# Patient Record
Sex: Male | Born: 1955 | Race: Black or African American | Hispanic: No | Marital: Married | Smoking: Former smoker
Health system: Southern US, Community
[De-identification: ages and names within clinical notes are randomized; demographics above are authoritative.]

## PROBLEM LIST (undated history)

## (undated) DIAGNOSIS — I10 Essential (primary) hypertension: Secondary | ICD-10-CM

## (undated) DIAGNOSIS — K219 Gastro-esophageal reflux disease without esophagitis: Secondary | ICD-10-CM

## (undated) HISTORY — PX: NECK SURGERY: SHX720

---

## 1999-01-09 ENCOUNTER — Ambulatory Visit (HOSPITAL_COMMUNITY): Admission: RE | Admit: 1999-01-09 | Discharge: 1999-01-09 | Payer: Self-pay | Admitting: Chiropractic Medicine

## 1999-01-09 ENCOUNTER — Encounter: Payer: Self-pay | Admitting: Chiropractic Medicine

## 2004-02-25 ENCOUNTER — Ambulatory Visit (HOSPITAL_BASED_OUTPATIENT_CLINIC_OR_DEPARTMENT_OTHER): Admission: RE | Admit: 2004-02-25 | Discharge: 2004-02-25 | Payer: Self-pay | Admitting: Orthopedic Surgery

## 2004-02-25 ENCOUNTER — Ambulatory Visit (HOSPITAL_COMMUNITY): Admission: RE | Admit: 2004-02-25 | Discharge: 2004-02-25 | Payer: Self-pay | Admitting: Orthopedic Surgery

## 2004-03-02 ENCOUNTER — Ambulatory Visit: Payer: Self-pay | Admitting: Internal Medicine

## 2005-05-04 ENCOUNTER — Ambulatory Visit (HOSPITAL_BASED_OUTPATIENT_CLINIC_OR_DEPARTMENT_OTHER): Admission: RE | Admit: 2005-05-04 | Discharge: 2005-05-04 | Payer: Self-pay | Admitting: Orthopedic Surgery

## 2005-05-04 ENCOUNTER — Encounter (INDEPENDENT_AMBULATORY_CARE_PROVIDER_SITE_OTHER): Payer: Self-pay | Admitting: Specialist

## 2006-06-02 ENCOUNTER — Encounter: Admission: RE | Admit: 2006-06-02 | Discharge: 2006-07-14 | Payer: Self-pay | Admitting: Internal Medicine

## 2006-11-02 ENCOUNTER — Encounter: Admission: RE | Admit: 2006-11-02 | Discharge: 2006-11-02 | Payer: Self-pay | Admitting: Neurosurgery

## 2006-11-16 ENCOUNTER — Encounter: Admission: RE | Admit: 2006-11-16 | Discharge: 2006-11-16 | Payer: Self-pay | Admitting: Neurosurgery

## 2006-12-08 ENCOUNTER — Ambulatory Visit: Payer: Self-pay | Admitting: Gastroenterology

## 2007-02-08 ENCOUNTER — Ambulatory Visit (HOSPITAL_COMMUNITY): Admission: RE | Admit: 2007-02-08 | Discharge: 2007-02-08 | Payer: Self-pay | Admitting: Neurosurgery

## 2008-02-06 ENCOUNTER — Emergency Department: Payer: Self-pay | Admitting: Emergency Medicine

## 2010-09-03 NOTE — Op Note (Signed)
Ricky Gross, Ricky Gross             ACCOUNT NO.:  [id number]   MEDICAL RECORD NO.:  [id number]          PATIENT TYPE:  AMB   LOCATION:  DSC                          FACILITY:  [facility]   PHYSICIAN:  Artist Pais. Weingold, M.D.DATE OF BIRTH:  12-15-1955   DATE OF PROCEDURE:  02/25/2004  DATE OF DISCHARGE:                                 OPERATIVE REPORT   PREOPERATIVE DIAGNOSIS:  Right carpal tunnel syndrome.   POSTOPERATIVE DIAGNOSIS:  Right carpal tunnel syndrome.   PROCEDURE:  Right carpal tunnel release.   SURGEON:  Artist Pais. Mina Marble, M.D.   ASSISTANT:  None.   ANESTHESIA:  General.   TOURNIQUET TIME:  Eleven minutes.   COMPLICATIONS:  None.   DRAINS:  None.   DESCRIPTION OF PROCEDURE:  The patient was taken to the operating room after  the induction of adequate general anesthesia.  The right upper extremity is  prepped and draped in the usual sterile fashion.  Esmarch was used to  exsanguinate the limb.  Tourniquet was inflated to 250 mmHg.  At this point  and time, a 2 cm incision was made in the palmar aspect of the right hand in  line with the long finger metacarpal starting at [facility] cardinal line.  The  incision was taken down through the skin and subcutaneous tissue.  The  palmar fascia was identified and split.  The distal edge of the transverse  carpal ligament was divided with a 15 blade exposing the underlying median  nerve and contents of the carpal canal.  The median nerve was protected with  a freer elevator and the remaining aspects of the transverse carpal ligament  were divided under direct vision using a curved blunt scissor.   The canal was inspected.  There were no osseous lesions or ganglias present.  The wound was irrigated and loosely closed with a running 3-0 Prolene  subcuticular stitch.  Steri-Strips, 4 by 4 fluffs, and compressive dressing  was applied.   The patient tolerated the procedure well and went to the recovery room in  stable  fashion.     MAW/MEDQ  D:  02/25/2004  T:  02/25/2004  Job:  [id number]

## 2010-09-03 NOTE — Op Note (Signed)
NAMELADON, Ricky Gross             ACCOUNT NO.:  [id number]   MEDICAL RECORD NO.:  [id number]          PATIENT TYPE:  AMB   LOCATION:  DSC                          FACILITY:  [facility]   PHYSICIAN:  Artist Pais. Weingold, M.D.DATE OF BIRTH:  1955-06-30   DATE OF PROCEDURE:  05/04/2005  DATE OF DISCHARGE:                                 OPERATIVE REPORT   PREOPERATIVE DIAGNOSIS:  Right elbow chronic lateral epicondylitis.   POSTOPERATIVE DIAGNOSIS:  Right elbow chronic lateral epicondylitis.   PROCEDURE:  Right elbow extensor carpi radialis brevis debridement with  partial lateral epicondylar ostectomy.   SURGEON:  Artist Pais. Mina Marble, M.D.   ASSISTANT:  None.   ANESTHESIA:  General.   TOURNIQUET TIME:  40 minutes.   COMPLICATIONS:  None.   DRAINS:  None.   OPERATIVE REPORT:  The patient was taken to the operating room after the  induction of adequate general anesthesia.  The right upper extremity was  prepped and draped in the usual sterile fashion.  An Esmarch was used to  exsanguinate the limb.  The tourniquet was then inflated to 250 mmHg.  At  this point in time a longitudinal incision was made over the lateral  epicondyle extended proximally off the epicondylar ridge for 1 cm and  distally for 4 cm.  The skin was incised.  The interval between the extensor  carpi radialis longus and the extensor digitorum communis was identified and  split.  Flaps were raised carefully off the underlying extensor carpi  radialis brevis.  There was significant degeneration and tearing of the  extensor carpi radialis brevis at its insertion into the lateral epicondyle.  This degenerated area was carefully excised down to the level of the joint  capsule.  Using a rongeur the remaining aspects of the extensor carpi  radialis brevis were debrided.  The epicondylar area was then decorticated  down to bleeding cancellous bone using a combination of a rongeur and  curettes. At this point in time  the interval between the [facility] and the ECRL  was repaired using inverted #0 Vicryl sutures from proximal to distal.  After this is done, the skin was closed with a running 3-0 Prolene  subcuticular stitch, Steri-Strips, 4 x 4 fluffs, compressive dressing, and a  posterior splint with the elbow flexed at 90-degrees and the forearm in  neutral and the wrist in slight extension was applied.   The patient tolerated the procedure well and was sent to the recovery room.      Artist Pais Mina Marble, M.D.  Electronically Signed     MAW/MEDQ  D:  05/04/2005  T:  05/04/2005  Job:  [id number]

## 2011-09-29 ENCOUNTER — Other Ambulatory Visit: Payer: Self-pay | Admitting: Occupational Medicine

## 2011-09-29 ENCOUNTER — Ambulatory Visit: Payer: Self-pay

## 2011-09-29 DIAGNOSIS — R52 Pain, unspecified: Secondary | ICD-10-CM

## 2016-11-14 ENCOUNTER — Ambulatory Visit: Payer: Self-pay | Admitting: Family Medicine

## 2017-02-14 DIAGNOSIS — K219 Gastro-esophageal reflux disease without esophagitis: Secondary | ICD-10-CM | POA: Diagnosis present

## 2017-02-17 DIAGNOSIS — R7303 Prediabetes: Secondary | ICD-10-CM | POA: Diagnosis present

## 2017-02-17 DIAGNOSIS — E7800 Pure hypercholesterolemia, unspecified: Secondary | ICD-10-CM | POA: Diagnosis present

## 2019-06-10 ENCOUNTER — Other Ambulatory Visit: Payer: Self-pay | Admitting: Family Medicine

## 2019-06-10 ENCOUNTER — Ambulatory Visit: Payer: Self-pay

## 2019-06-10 ENCOUNTER — Encounter (HOSPITAL_COMMUNITY): Payer: Self-pay | Admitting: Family Medicine

## 2019-06-10 ENCOUNTER — Other Ambulatory Visit: Payer: Self-pay

## 2019-06-10 ENCOUNTER — Ambulatory Visit
Admission: RE | Admit: 2019-06-10 | Discharge: 2019-06-10 | Disposition: A | Discharge status: Home / Self Care | Payer: No Typology Code available for payment source | Source: Ambulatory Visit | Attending: Family Medicine | Admitting: Family Medicine

## 2019-06-10 ENCOUNTER — Ambulatory Visit (HOSPITAL_COMMUNITY)
Admission: EM | Admit: 2019-06-10 | Discharge: 2019-06-10 | Disposition: A | Discharge status: Home / Self Care | Payer: Self-pay

## 2019-06-10 DIAGNOSIS — M25552 Pain in left hip: Secondary | ICD-10-CM

## 2019-06-10 NOTE — ED Notes (Signed)
Patient came to the wrong place, arrived mistakenly by registration. Patient given directions to occupational health building.

## 2019-07-18 DIAGNOSIS — I10 Essential (primary) hypertension: Secondary | ICD-10-CM | POA: Diagnosis present

## 2019-08-02 ENCOUNTER — Encounter: Payer: Self-pay | Admitting: Emergency Medicine

## 2019-08-02 ENCOUNTER — Emergency Department: Payer: BC Managed Care – PPO

## 2019-08-02 ENCOUNTER — Other Ambulatory Visit: Payer: Self-pay

## 2019-08-02 ENCOUNTER — Emergency Department
Admission: EM | Admit: 2019-08-02 | Discharge: 2019-08-02 | Disposition: A | Discharge status: Home / Self Care | Payer: BC Managed Care – PPO | Attending: Emergency Medicine | Admitting: Emergency Medicine

## 2019-08-02 DIAGNOSIS — Z87891 Personal history of nicotine dependence: Secondary | ICD-10-CM | POA: Diagnosis not present

## 2019-08-02 DIAGNOSIS — E162 Hypoglycemia, unspecified: Secondary | ICD-10-CM | POA: Diagnosis not present

## 2019-08-02 DIAGNOSIS — Y908 Blood alcohol level of 240 mg/100 ml or more: Secondary | ICD-10-CM | POA: Diagnosis not present

## 2019-08-02 DIAGNOSIS — F10920 Alcohol use, unspecified with intoxication, uncomplicated: Secondary | ICD-10-CM | POA: Diagnosis not present

## 2019-08-02 DIAGNOSIS — E860 Dehydration: Secondary | ICD-10-CM | POA: Insufficient documentation

## 2019-08-02 DIAGNOSIS — R4182 Altered mental status, unspecified: Secondary | ICD-10-CM | POA: Diagnosis present

## 2019-08-02 LAB — URINE DRUG SCREEN, QUALITATIVE (ARMC ONLY)
Amphetamines, Ur Screen: NOT DETECTED
Barbiturates, Ur Screen: NOT DETECTED
Benzodiazepine, Ur Scrn: NOT DETECTED
Cannabinoid 50 Ng, Ur ~~LOC~~: NOT DETECTED
Cocaine Metabolite,Ur ~~LOC~~: NOT DETECTED
MDMA (Ecstasy)Ur Screen: NOT DETECTED
Methadone Scn, Ur: NOT DETECTED
Opiate, Ur Screen: NOT DETECTED
Phencyclidine (PCP) Ur S: NOT DETECTED
Tricyclic, Ur Screen: NOT DETECTED

## 2019-08-02 LAB — CBC
HCT: 47.8 % (ref 39.0–52.0)
Hemoglobin: 16 g/dL (ref 13.0–17.0)
MCH: 30.2 pg (ref 26.0–34.0)
MCHC: 33.5 g/dL (ref 30.0–36.0)
MCV: 90.2 fL (ref 80.0–100.0)
Platelets: 242 10*3/uL (ref 150–400)
RBC: 5.3 MIL/uL (ref 4.22–5.81)
RDW: 13.2 % (ref 11.5–15.5)
WBC: 4.8 10*3/uL (ref 4.0–10.5)
nRBC: 0 % (ref 0.0–0.2)

## 2019-08-02 LAB — COMPREHENSIVE METABOLIC PANEL
ALT: 16 U/L (ref 0–44)
AST: 19 U/L (ref 15–41)
Albumin: 4.6 g/dL (ref 3.5–5.0)
Alkaline Phosphatase: 70 U/L (ref 38–126)
Anion gap: 17 — ABNORMAL HIGH (ref 5–15)
BUN: 18 mg/dL (ref 8–23)
CO2: 19 mmol/L — ABNORMAL LOW (ref 22–32)
Calcium: 9.1 mg/dL (ref 8.9–10.3)
Chloride: 104 mmol/L (ref 98–111)
Creatinine, Ser: 1 mg/dL (ref 0.61–1.24)
GFR calc Af Amer: >60 mL/min (ref >60)
GFR calc non Af Amer: >60 mL/min (ref >60)
Glucose, Bld: 70 mg/dL (ref 70–99)
Potassium: 4.3 mmol/L (ref 3.5–5.1)
Sodium: 140 mmol/L (ref 135–145)
Total Bilirubin: 0.8 mg/dL (ref 0.3–1.2)
Total Protein: 7.7 g/dL (ref 6.5–8.1)

## 2019-08-02 LAB — BASIC METABOLIC PANEL
Anion gap: 10 (ref 5–15)
BUN: 18 mg/dL (ref 8–23)
CO2: 29 mmol/L (ref 22–32)
Calcium: 8.9 mg/dL (ref 8.9–10.3)
Chloride: 106 mmol/L (ref 98–111)
Creatinine, Ser: 1.02 mg/dL (ref 0.61–1.24)
GFR calc Af Amer: >60 mL/min (ref >60)
GFR calc non Af Amer: >60 mL/min (ref >60)
Glucose, Bld: 123 mg/dL — ABNORMAL HIGH (ref 70–99)
Potassium: 3.8 mmol/L (ref 3.5–5.1)
Sodium: 145 mmol/L (ref 135–145)

## 2019-08-02 LAB — GLUCOSE, CAPILLARY
Glucose-Capillary: 65 mg/dL — ABNORMAL LOW (ref 70–99)
Glucose-Capillary: 66 mg/dL — ABNORMAL LOW (ref 70–99)
Glucose-Capillary: 107 mg/dL — ABNORMAL HIGH (ref 70–99)

## 2019-08-02 LAB — ETHANOL: Alcohol, Ethyl (B): 340 mg/dL (ref <10)

## 2019-08-02 MED ORDER — SODIUM CHLORIDE 0.9 % IV BOLUS
1000.0000 mL | Freq: Once | INTRAVENOUS | Status: AC
  Administered 2019-08-02: 1000 mL via INTRAVENOUS

## 2019-08-02 MED ORDER — CHLORDIAZEPOXIDE HCL 5 MG PO CAPS: 5.0000 mg | ORAL_CAPSULE | Freq: Three times a day (TID) | ORAL | 0 refills | Status: DC | PRN

## 2019-08-02 MED ORDER — DEXTROSE 50 % IV SOLN
50.0000 mL | Freq: Once | INTRAVENOUS | Status: AC
  Administered 2019-08-02: 50 mL via INTRAVENOUS
  Filled 2019-08-02: qty 50

## 2019-08-02 NOTE — ED Triage Notes (Signed)
Pt in via EMS from home with c/o overdose. EMS reports pts brother found him laying on the floor and he was acting right. Pt denies ETOH or drug use. EMS reports alcohol and paraphernalia at the scene.

## 2019-08-02 NOTE — ED Notes (Signed)
IV fluids finished, repeat blood work drawn and to the lab.

## 2019-08-02 NOTE — ED Provider Notes (Signed)
[facility] EMERGENCY DEPARTMENT Provider Note   CSN: [id number] Arrival date & time: 08/02/19  1607     History Chief Complaint  Patient presents with  . Drug Overdose    Ricky Gross is a 64 y.o. male hx of alcohol use who presented with confusion.  Patient apparently was drinking alcohol today and was found by his brother laying on the floor and altered .  Patient apparently was found to have other substances next to him as well.  Patient denies drinking alcohol but appears visibly intoxicated.  Patient was also noted to be very confused in triage.  Denies any suicidal homicidal ideations.  He denies any drug use.  He was noted to be hypoglycemic to 66 in triage.  The history is provided by the patient.       History reviewed. No pertinent past medical history.  There are no problems to display for this patient.   History reviewed. No pertinent surgical history.     No family history on file.  Social History   Tobacco Use  . Smoking status: Former Research scientist (life sciences)  . Smokeless tobacco: Never Used  Substance Use Topics  . Alcohol use: Not Currently  . Drug use: Not Currently    Home Medications Prior to Admission medications   Not on File    Allergies    Patient has no known allergies.  Review of Systems   Review of Systems  Psychiatric/Behavioral: Positive for confusion.  All other systems reviewed and are negative.   Physical Exam Updated Vital Signs BP 124/76 (BP Location: Left Arm)   Pulse 88   Temp 97.7 F (36.5 C) (Oral)   Resp 20   Ht 5\' 5"  (1.651 m)   Wt 72.6 kg   SpO2 96%   BMI 26.63 kg/m   Physical Exam Vitals and nursing note reviewed.  Constitutional:      Comments: Confused, intoxicated   HENT:     Head: Normocephalic.     Comments: No obvious scalp hematoma     Mouth/Throat:     Mouth: Mucous membranes are moist.  Eyes:     Extraocular Movements: Extraocular movements intact.     Pupils: Pupils are equal,  round, and reactive to light.  Cardiovascular:     Rate and Rhythm: Normal rate and regular rhythm.     Pulses: Normal pulses.     Heart sounds: Normal heart sounds.  Pulmonary:     Effort: Pulmonary effort is normal.     Breath sounds: Normal breath sounds.  Abdominal:     General: Abdomen is flat.     Palpations: Abdomen is soft.  Musculoskeletal:        General: Normal range of motion.     Cervical back: Normal range of motion.  Skin:    General: Skin is warm.     Capillary Refill: Capillary refill takes less than 2 seconds.  Neurological:     Comments: Disoriented, moving all extremities, unable to walk due to intoxication      ED Results / Procedures / Treatments   Labs (all labs ordered are listed, but only abnormal results are displayed) Labs Reviewed  GLUCOSE, CAPILLARY - Abnormal; Notable for the following components:      Result Value   Glucose-Capillary 66 (*)    All other components within normal limits  COMPREHENSIVE METABOLIC PANEL - Abnormal; Notable for the following components:   CO2 19 (*)    Anion gap 17 (*)  All other components within normal limits  ETHANOL - Abnormal; Notable for the following components:   Alcohol, Ethyl (B) 340 (*)    All other components within normal limits  GLUCOSE, CAPILLARY - Abnormal; Notable for the following components:   Glucose-Capillary 65 (*)    All other components within normal limits  CBC  URINE DRUG SCREEN, QUALITATIVE ([facility] ONLY)  CBG MONITORING, ED    EKG None  Radiology No results found.  Procedures Procedures (including critical care time)  Medications Ordered in ED Medications  dextrose 50 % solution 50 mL (50 mLs Intravenous Given 08/02/19 1718)  sodium chloride 0.9 % bolus 1,000 mL (1,000 mLs Intravenous New Bag/Given 08/02/19 1718)    ED Course  I have reviewed the triage vital signs and the nursing notes.  Pertinent labs & imaging results that were available during my care of the patient  were reviewed by me and considered in my medical decision making (see chart for details).    MDM Rules/Calculators/A&P                     Ricky Gross is a 64 y.o. male is here with confusion.  Likely alcohol intoxication.  Also consider polysubstance abuse as well .  No obvious signs of head injury.  Patient appears very confused so we will get a CT head .  Will get labs and tox levels as well.  Additional history obtained:  Previous records obtained and reviewed   Lab Tests:  I Ordered, reviewed, and interpreted labs, which included:   CBC, CMP, ETOH  Imaging Studies ordered:  I ordered imaging studies which included CT head, I independently visualized and interpreted imaging which showed normal  Medicines ordered:  I ordered medication D50 For hypoglycemia    9:10 PM Patient here with confusion and alcohol intoxication.  Patient's initial alcohol level was about 340 .  Patient also has some mild anion gap acidosis as well.  He adamantly denies any toxic alcohol ingestion.  After IV fluids, his BMP showed normal anion gap.  His blood sugar was initially 65 but after some food and D50, and went up to 123 on the chemistry.  Patient has been eating and drinking well. Wife is at bedside and will take him home.  Told him to stop drinking and patient can take some Librium as needed for withdrawal.     Final Clinical Impression(s) / ED Diagnoses Final diagnoses:  None    Rx / DC Orders ED Discharge Orders    None       Charlynne Pander, MD 08/02/19 2111

## 2019-08-02 NOTE — ED Notes (Signed)
Repeat blood glucose 107, patient given a sandwich box. Will repeat BNP after meal and IVF bolus is completed.

## 2019-08-02 NOTE — Discharge Instructions (Addendum)
Stay hydrated  Take librium as needed   You drank too much alcohol, avoid drinking alcohol   See your doctor  Return to ER if you have passing out, lethargy, vomiting, withdrawal

## 2019-08-02 NOTE — ED Notes (Signed)
Patient off unit to ct  

## 2019-08-02 NOTE — ED Triage Notes (Signed)
See first RN note; pt continues to deny ETOH/drug use. Pt with noted slurring words in triage, repeatedly states, "if it was about 12 o'clock I might".

## 2019-08-02 NOTE — ED Notes (Signed)
Wife at bedside.

## 2020-02-10 DIAGNOSIS — N528 Other male erectile dysfunction: Secondary | ICD-10-CM | POA: Insufficient documentation

## 2020-05-29 ENCOUNTER — Other Ambulatory Visit: Payer: Self-pay | Admitting: Family Medicine

## 2020-05-29 DIAGNOSIS — G8929 Other chronic pain: Secondary | ICD-10-CM

## 2020-05-29 DIAGNOSIS — M5442 Lumbago with sciatica, left side: Secondary | ICD-10-CM

## 2020-06-06 ENCOUNTER — Other Ambulatory Visit: Payer: Self-pay

## 2020-06-06 ENCOUNTER — Ambulatory Visit
Admission: RE | Admit: 2020-06-06 | Discharge: 2020-06-06 | Disposition: A | Discharge status: Home / Self Care | Payer: BC Managed Care – PPO | Source: Ambulatory Visit | Attending: Family Medicine | Admitting: Family Medicine

## 2020-06-06 DIAGNOSIS — M5442 Lumbago with sciatica, left side: Secondary | ICD-10-CM | POA: Diagnosis not present

## 2020-06-06 DIAGNOSIS — G8929 Other chronic pain: Secondary | ICD-10-CM

## 2021-07-11 IMAGING — MR MR LUMBAR SPINE W/O CM
5 series · 31 of 48 positions shown · non-contrast
Comparison: Lumbar CT myelogram 02/08/2007

CLINICAL DATA: Low back pain with left leg pain and tingling. Fall
in [DATE].

EXAM:
MRI LUMBAR SPINE WITHOUT CONTRAST
TECHNIQUE: Multiplanar, multisequence MR imaging of the lumbar spine was
performed. No intravenous contrast was administered.

[Series 5: T2 · sagittal · 4.0mm · 0.81mm/px · 6 of 15 slices shown (1 of 2)]
[im 1/15]
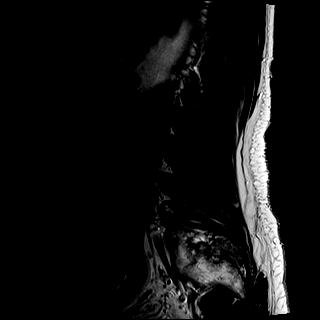
[im 3/15]
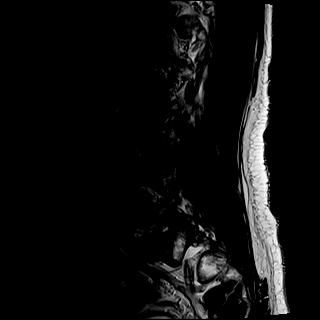
[im 6/15]
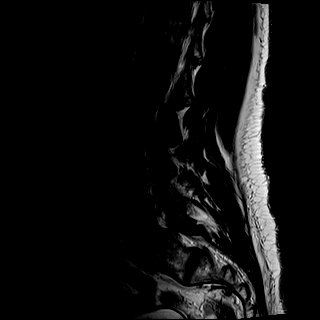
[im 9/15]
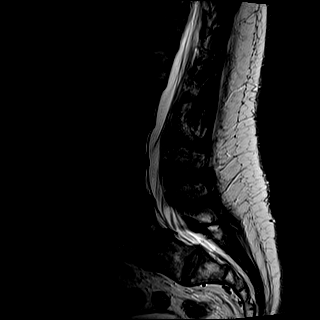
[im 12/15]
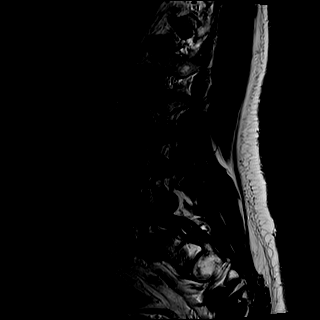
[im 15/15]
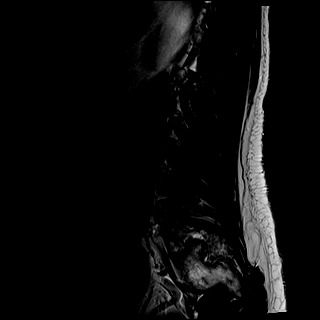

[Series 6: T1 · sagittal · 4.0mm · 0.81mm/px · 7 of 15 slices shown (1 of 2)]
[im 1/15]
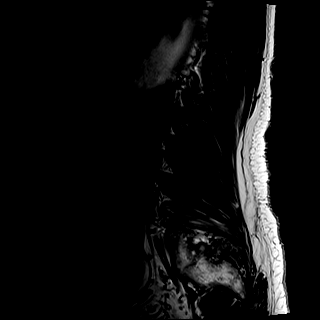
[im 3/15]
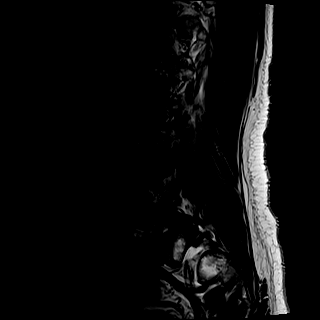
[im 5/15]
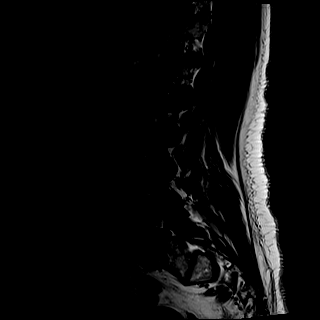
[im 8/15]
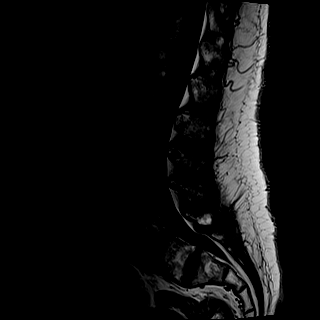
[im 10/15]
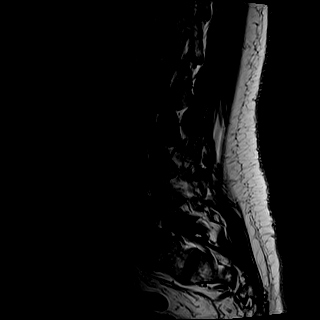
[im 12/15]
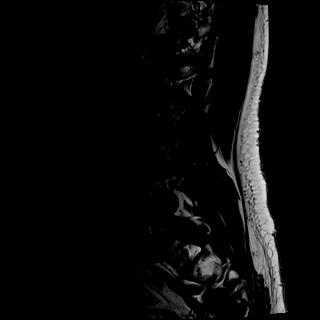
[im 15/15]
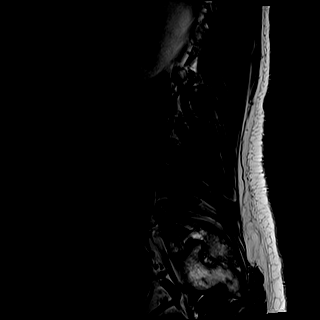

[Series 7: STIR · sagittal · 4.0mm · 0.41mm/px · 2 of 15 slices shown]
[im 1/15]
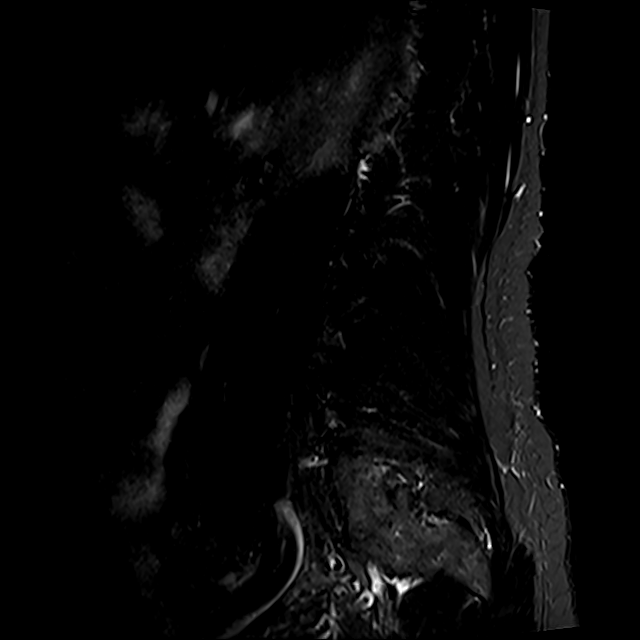
[im 3/15]
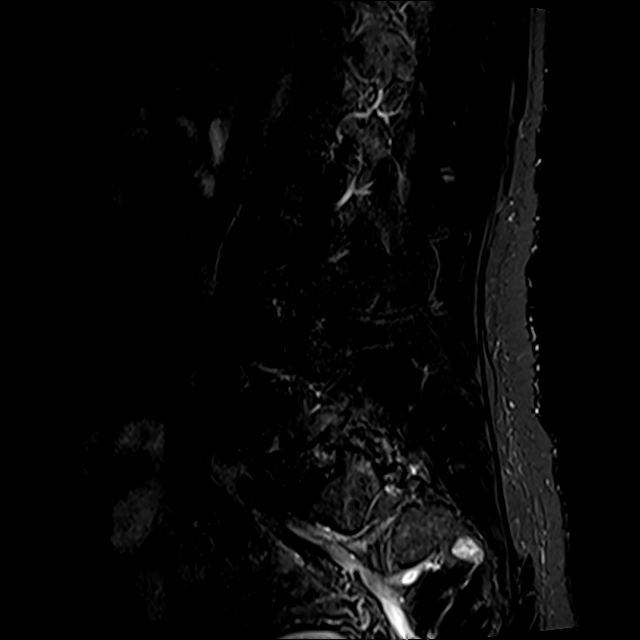

[Series 8: T2 · axial · 4.0mm · 0.78mm/px · z∈[-165,+62]mm · 8 of 32 slices shown (2 of 2)]
[im 1/32]
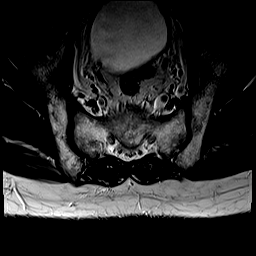
[im 5/32]
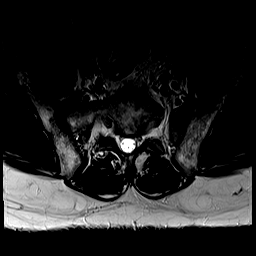
[im 10/32]
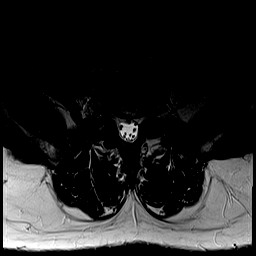
[im 15/32]
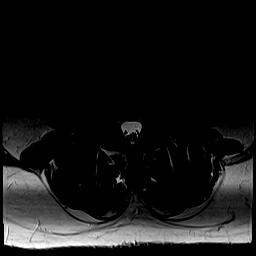
[im 17/32]
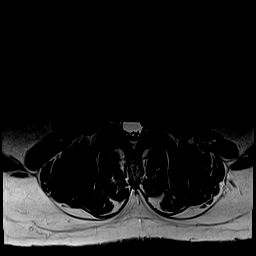
[im 22/32]
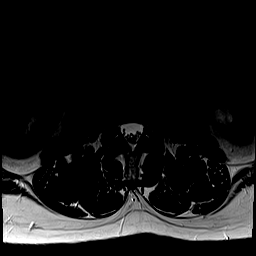
[im 27/32]
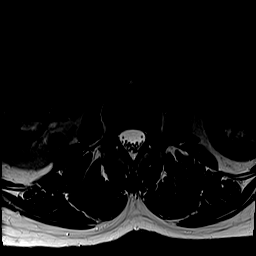
[im 32/32]
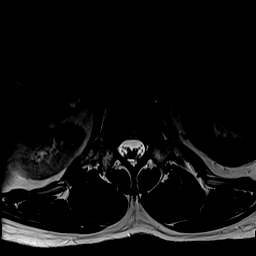

[Series 9: T1 · axial · 4.0mm · 0.39mm/px · z∈[-165,+62]mm · 8 of 32 slices shown (2 of 2)]
[im 1/32]
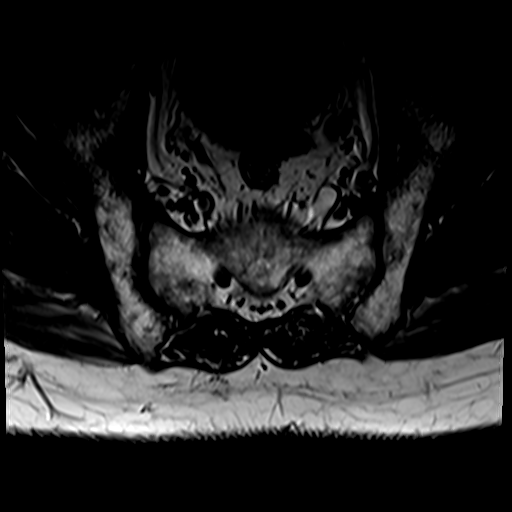
[im 5/32]
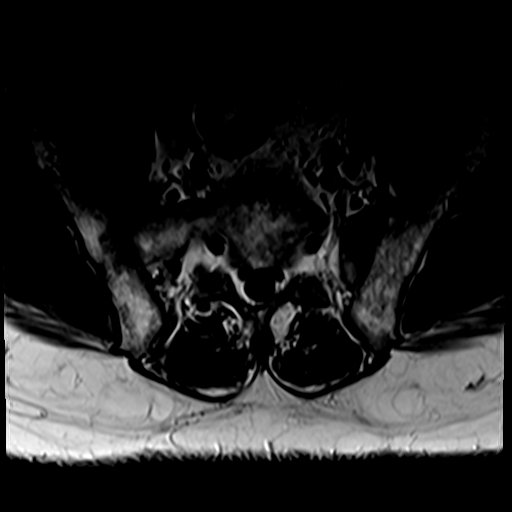
[im 10/32]
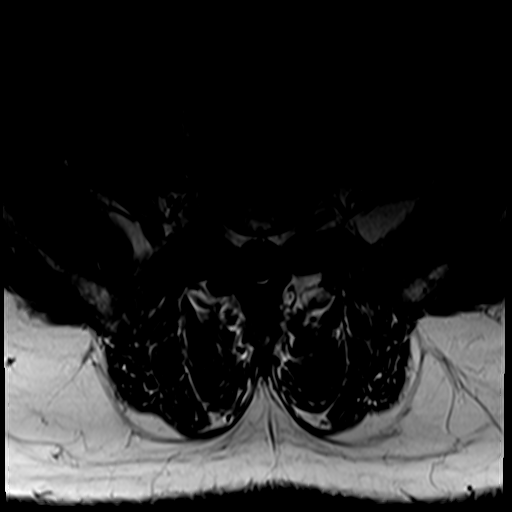
[im 15/32]
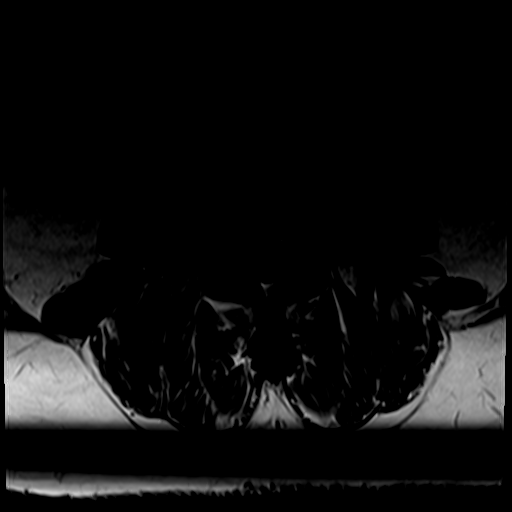
[im 17/32]
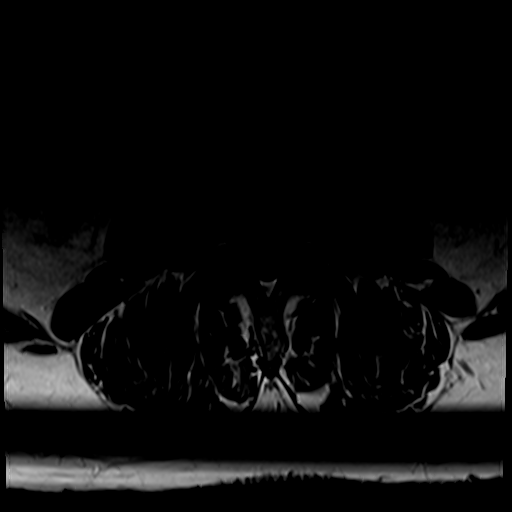
[im 22/32]
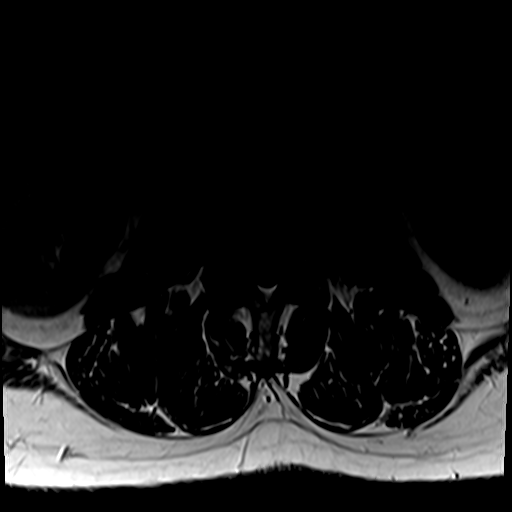
[im 27/32]
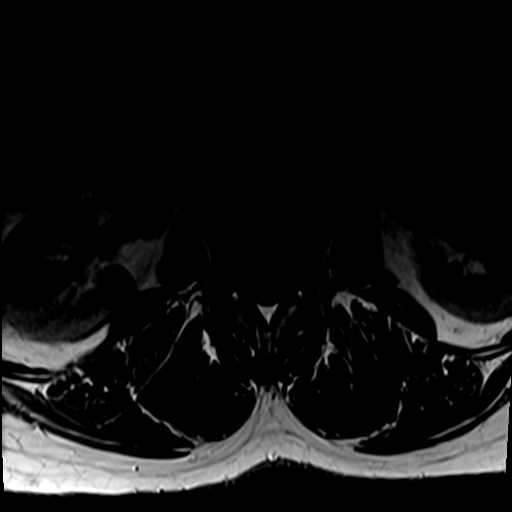
[im 32/32]
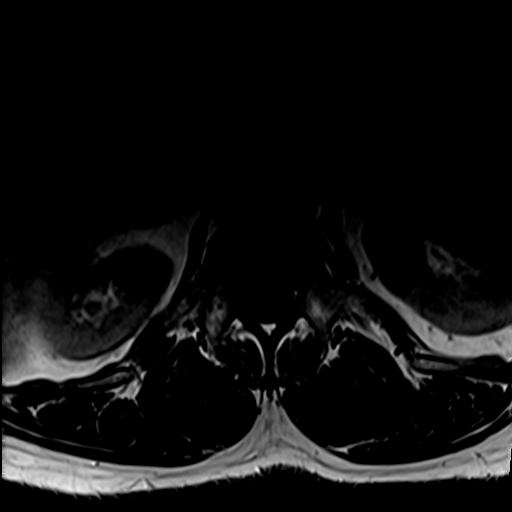

[31 of 48 positions shown; findings below may reference images not displayed]

FINDINGS: Segmentation: Transitional lumbosacral anatomy with partial
lumbarization of S1, the same numbering used on the prior myelogram.

Alignment:  Unchanged alignment.  No significant listhesis.

Vertebrae: No fracture or suspicious osseous lesion. Mild edema
about the facet joints bilaterally at L5-S1.

Conus medullaris and cauda equina: Conus extends to the upper L2
level. Conus and cauda equina appear normal.

Paraspinal and other soft tissues: 1.7 cm cyst in the lower pole of
the left kidney.

Disc levels:

Disc desiccation and mild disc space narrowing throughout the lumbar
spine.

L1-2: Negative.

L2-3: Disc bulging result in new mild left greater than right
lateral recess stenosis and mild bilateral neural foraminal stenosis
without spinal stenosis.

L3-4: Disc bulging result in mild bilateral lateral recess stenosis
and mild bilateral neural foraminal stenosis, mildly progressed. No
spinal stenosis.

L4-5: Disc bulging and mild facet hypertrophy result in new mild
bilateral lateral recess stenosis and mild-to-moderate right and
mild left neural foraminal stenosis without spinal stenosis.

L5-S1: Disc bulging and moderate facet hypertrophy result in new
mild bilateral lateral recess stenosis and increased moderate
bilateral neural foraminal stenosis without spinal stenosis.
IMPRESSION: Progressive lumbar disc and facet degeneration with mild lateral
recess stenosis from L2-3 to L5-S1 and moderate bilateral neural
foraminal stenosis at L5-S1.

## 2021-10-05 ENCOUNTER — Ambulatory Visit (LOCAL_COMMUNITY_HEALTH_CENTER): Payer: Medicare HMO

## 2021-10-05 DIAGNOSIS — Z23 Encounter for immunization: Secondary | ICD-10-CM

## 2021-10-05 DIAGNOSIS — Z719 Counseling, unspecified: Secondary | ICD-10-CM

## 2021-10-05 NOTE — Progress Notes (Signed)
  Are you feeling sick today? No   Have you ever received a dose of COVID-19 Vaccine? [employer], [place], [place], [place], Other) Yes  If yes, which vaccine and how many doses?   Pfizer 5 doses   Did you bring the vaccination record card or other documentation?  No   Do you have a health condition or are undergoing treatment that makes you moderately or severely immunocompromised? This would include, but not be limited to: cancer, HIV, organ transplant, immunosuppressive therapy/high-dose corticosteroids, or moderate/severe primary immunodeficiency.  No  Have you received COVID-19 vaccine before or during hematopoietic cell transplant (HCT) or CAR-T-cell therapies? No  Have you ever had an allergic reaction to: (This would include a severe allergic reaction or a reaction that caused hives, swelling, or respiratory distress, including wheezing.) A component of a COVID-19 vaccine or a previous dose of COVID-19 vaccine? No   Have you ever had an allergic reaction to another vaccine (other thanCOVID-19 vaccine) or an injectable medication? (This would include a severe allergic reaction or a reaction that caused hives, swelling, or respiratory distress, including wheezing.)   No    Do you have a history of any of the following:  Myocarditis or Pericarditis No  Dermal fillers:  No  Multisystem Inflammatory Syndrome (MIS-C or MIS-A)? No  COVID-19 disease within the past 3 months? No  Vaccinated with monkeypox vaccine in the last 4 weeks? No   Presents to nurse clinic requesting Pfizer BV booster #2.  Denies problem with previous vaccines. Pfizer BV administered IM left deltoid; tolerated well. [facility] updated and copy to pt. Cherlynn Polo, RN

## 2021-10-06 ENCOUNTER — Ambulatory Visit: Payer: Medicare HMO

## 2021-11-19 ENCOUNTER — Emergency Department: Payer: Medicare HMO

## 2021-11-19 ENCOUNTER — Other Ambulatory Visit: Payer: Self-pay

## 2021-11-19 ENCOUNTER — Inpatient Hospital Stay
Admission: EM | Admit: 2021-11-19 | Discharge: 2021-11-24 | DRG: 098 | Disposition: A | Discharge status: Home / Self Care | Payer: Medicare HMO | Attending: Internal Medicine | Admitting: Internal Medicine

## 2021-11-19 DIAGNOSIS — R42 Dizziness and giddiness: Secondary | ICD-10-CM | POA: Diagnosis not present

## 2021-11-19 DIAGNOSIS — K449 Diaphragmatic hernia without obstruction or gangrene: Secondary | ICD-10-CM | POA: Diagnosis not present

## 2021-11-19 DIAGNOSIS — R6520 Severe sepsis without septic shock: Secondary | ICD-10-CM | POA: Diagnosis not present

## 2021-11-19 DIAGNOSIS — G030 Nonpyogenic meningitis: Secondary | ICD-10-CM | POA: Diagnosis present

## 2021-11-19 DIAGNOSIS — Z87891 Personal history of nicotine dependence: Secondary | ICD-10-CM

## 2021-11-19 DIAGNOSIS — I129 Hypertensive chronic kidney disease with stage 1 through stage 4 chronic kidney disease, or unspecified chronic kidney disease: Secondary | ICD-10-CM | POA: Diagnosis present

## 2021-11-19 DIAGNOSIS — I10 Essential (primary) hypertension: Secondary | ICD-10-CM | POA: Diagnosis not present

## 2021-11-19 DIAGNOSIS — N179 Acute kidney failure, unspecified: Secondary | ICD-10-CM | POA: Diagnosis present

## 2021-11-19 DIAGNOSIS — B349 Viral infection, unspecified: Secondary | ICD-10-CM | POA: Diagnosis not present

## 2021-11-19 DIAGNOSIS — N200 Calculus of kidney: Secondary | ICD-10-CM | POA: Diagnosis not present

## 2021-11-19 DIAGNOSIS — I700 Atherosclerosis of aorta: Secondary | ICD-10-CM | POA: Diagnosis not present

## 2021-11-19 DIAGNOSIS — J9811 Atelectasis: Secondary | ICD-10-CM | POA: Diagnosis not present

## 2021-11-19 DIAGNOSIS — R531 Weakness: Secondary | ICD-10-CM | POA: Diagnosis not present

## 2021-11-19 DIAGNOSIS — R0602 Shortness of breath: Secondary | ICD-10-CM | POA: Diagnosis not present

## 2021-11-19 DIAGNOSIS — K219 Gastro-esophageal reflux disease without esophagitis: Secondary | ICD-10-CM | POA: Diagnosis present

## 2021-11-19 DIAGNOSIS — N1831 Chronic kidney disease, stage 3a: Secondary | ICD-10-CM | POA: Diagnosis present

## 2021-11-19 DIAGNOSIS — D6959 Other secondary thrombocytopenia: Secondary | ICD-10-CM | POA: Diagnosis present

## 2021-11-19 DIAGNOSIS — R519 Headache, unspecified: Secondary | ICD-10-CM | POA: Diagnosis not present

## 2021-11-19 DIAGNOSIS — Z8042 Family history of malignant neoplasm of prostate: Secondary | ICD-10-CM | POA: Diagnosis not present

## 2021-11-19 DIAGNOSIS — N281 Cyst of kidney, acquired: Secondary | ICD-10-CM | POA: Diagnosis not present

## 2021-11-19 DIAGNOSIS — D696 Thrombocytopenia, unspecified: Secondary | ICD-10-CM | POA: Diagnosis present

## 2021-11-19 DIAGNOSIS — E860 Dehydration: Secondary | ICD-10-CM | POA: Diagnosis present

## 2021-11-19 DIAGNOSIS — E871 Hypo-osmolality and hyponatremia: Secondary | ICD-10-CM | POA: Diagnosis present

## 2021-11-19 DIAGNOSIS — Z20822 Contact with and (suspected) exposure to covid-19: Secondary | ICD-10-CM | POA: Diagnosis present

## 2021-11-19 DIAGNOSIS — E7800 Pure hypercholesterolemia, unspecified: Secondary | ICD-10-CM | POA: Diagnosis present

## 2021-11-19 DIAGNOSIS — R61 Generalized hyperhidrosis: Secondary | ICD-10-CM | POA: Diagnosis not present

## 2021-11-19 DIAGNOSIS — K429 Umbilical hernia without obstruction or gangrene: Secondary | ICD-10-CM | POA: Diagnosis not present

## 2021-11-19 DIAGNOSIS — I959 Hypotension, unspecified: Secondary | ICD-10-CM | POA: Diagnosis present

## 2021-11-19 DIAGNOSIS — R7303 Prediabetes: Secondary | ICD-10-CM | POA: Diagnosis present

## 2021-11-19 DIAGNOSIS — R509 Fever, unspecified: Secondary | ICD-10-CM | POA: Diagnosis present

## 2021-11-19 DIAGNOSIS — A419 Sepsis, unspecified organism: Secondary | ICD-10-CM | POA: Diagnosis not present

## 2021-11-19 DIAGNOSIS — R7401 Elevation of levels of liver transaminase levels: Secondary | ICD-10-CM | POA: Diagnosis not present

## 2021-11-19 HISTORY — DX: Gastro-esophageal reflux disease without esophagitis: K21.9

## 2021-11-19 HISTORY — DX: Essential (primary) hypertension: I10

## 2021-11-19 LAB — URINALYSIS, COMPLETE (UACMP) WITH MICROSCOPIC
Bilirubin Urine: NEGATIVE
Glucose, UA: NEGATIVE mg/dL
Ketones, ur: NEGATIVE mg/dL
Leukocytes,Ua: NEGATIVE
Nitrite: NEGATIVE
Protein, ur: NEGATIVE mg/dL
Specific Gravity, Urine: 1.013 (ref 1.005–1.030)
pH: 5 (ref 5.0–8.0)

## 2021-11-19 LAB — CBC WITH DIFFERENTIAL/PLATELET
Abs Immature Granulocytes: 0.04 10*3/uL (ref 0.00–0.07)
Basophils Absolute: 0 10*3/uL (ref 0.0–0.1)
Basophils Relative: 1 %
Eosinophils Absolute: 0 10*3/uL (ref 0.0–0.5)
Eosinophils Relative: 0 %
HCT: 41.6 % (ref 39.0–52.0)
Hemoglobin: 14.3 g/dL (ref 13.0–17.0)
Immature Granulocytes: 1 %
Lymphocytes Relative: 10 %
Lymphs Abs: 0.6 10*3/uL — ABNORMAL LOW (ref 0.7–4.0)
MCH: 28.9 pg (ref 26.0–34.0)
MCHC: 34.4 g/dL (ref 30.0–36.0)
MCV: 84 fL (ref 80.0–100.0)
Monocytes Absolute: 0.3 10*3/uL (ref 0.1–1.0)
Monocytes Relative: 5 %
Neutro Abs: 5.1 10*3/uL (ref 1.7–7.7)
Neutrophils Relative %: 83 %
Platelets: 113 10*3/uL — ABNORMAL LOW (ref 150–400)
RBC: 4.95 MIL/uL (ref 4.22–5.81)
RDW: 13.2 % (ref 11.5–15.5)
WBC: 6 10*3/uL (ref 4.0–10.5)
nRBC: 0 % (ref 0.0–0.2)

## 2021-11-19 LAB — HIV ANTIBODY (ROUTINE TESTING W REFLEX): HIV Screen 4th Generation wRfx: NONREACTIVE

## 2021-11-19 LAB — TSH: TSH: 1.359 u[IU]/mL (ref 0.350–4.500)

## 2021-11-19 LAB — COMPREHENSIVE METABOLIC PANEL
ALT: 72 U/L — ABNORMAL HIGH (ref 0–44)
AST: 88 U/L — ABNORMAL HIGH (ref 15–41)
Albumin: 4.2 g/dL (ref 3.5–5.0)
Alkaline Phosphatase: 122 U/L (ref 38–126)
Anion gap: 13 (ref 5–15)
BUN: 42 mg/dL — ABNORMAL HIGH (ref 8–23)
CO2: 24 mmol/L (ref 22–32)
Calcium: 8.7 mg/dL — ABNORMAL LOW (ref 8.9–10.3)
Chloride: 88 mmol/L — ABNORMAL LOW (ref 98–111)
Creatinine, Ser: 2.39 mg/dL — ABNORMAL HIGH (ref 0.61–1.24)
GFR, Estimated: 29 mL/min — ABNORMAL LOW (ref >60)
Glucose, Bld: 111 mg/dL — ABNORMAL HIGH (ref 70–99)
Potassium: 3.9 mmol/L (ref 3.5–5.1)
Sodium: 125 mmol/L — ABNORMAL LOW (ref 135–145)
Total Bilirubin: 0.9 mg/dL (ref 0.3–1.2)
Total Protein: 7.9 g/dL (ref 6.5–8.1)

## 2021-11-19 LAB — APTT: aPTT: 31 seconds (ref 24–36)

## 2021-11-19 LAB — LACTIC ACID, PLASMA
Lactic Acid, Venous: 2.2 mmol/L (ref 0.5–1.9)
Lactic Acid, Venous: 1.4 mmol/L (ref 0.5–1.9)

## 2021-11-19 LAB — PROTIME-INR
INR: 1 (ref 0.8–1.2)
Prothrombin Time: 12.9 seconds (ref 11.4–15.2)

## 2021-11-19 LAB — HEMOGLOBIN A1C
Hgb A1c MFr Bld: 6.3 % — ABNORMAL HIGH (ref 4.8–5.6)
Mean Plasma Glucose: 134.11 mg/dL

## 2021-11-19 LAB — PROCALCITONIN: Procalcitonin: 1.3 ng/mL

## 2021-11-19 LAB — SARS CORONAVIRUS 2 BY RT PCR: SARS Coronavirus 2 by RT PCR: NEGATIVE

## 2021-11-19 MED ORDER — LACTATED RINGERS IV SOLN: INTRAVENOUS | Status: DC

## 2021-11-19 MED ORDER — SODIUM CHLORIDE 0.9 % IV SOLN
2.0000 g | INTRAVENOUS | Status: DC
  Administered 2021-11-20 – 2021-11-23 (×4): 2 g via INTRAVENOUS
  Filled 2021-11-19 (×2): qty 20
  Filled 2021-11-19: qty 2
  Filled 2021-11-19: qty 20

## 2021-11-19 MED ORDER — GABAPENTIN 300 MG PO CAPS: 300.0000 mg | ORAL_CAPSULE | Freq: Three times a day (TID) | ORAL | Status: DC

## 2021-11-19 MED ORDER — GABAPENTIN 300 MG PO CAPS
600.0000 mg | ORAL_CAPSULE | Freq: Two times a day (BID) | ORAL | Status: DC
  Administered 2021-11-19 – 2021-11-24 (×10): 600 mg via ORAL
  Filled 2021-11-19 (×10): qty 2

## 2021-11-19 MED ORDER — PANTOPRAZOLE SODIUM 40 MG PO TBEC
40.0000 mg | DELAYED_RELEASE_TABLET | Freq: Every day | ORAL | Status: DC
  Administered 2021-11-20 – 2021-11-24 (×5): 40 mg via ORAL
  Filled 2021-11-19 (×5): qty 1

## 2021-11-19 MED ORDER — SODIUM CHLORIDE 0.9 % IV SOLN: Freq: Once | INTRAVENOUS | Status: AC

## 2021-11-19 MED ORDER — SODIUM CHLORIDE 0.9 % IV SOLN
1.0000 g | Freq: Once | INTRAVENOUS | Status: AC
  Administered 2021-11-19: 1 g via INTRAVENOUS
  Filled 2021-11-19: qty 10

## 2021-11-19 MED ORDER — SODIUM CHLORIDE 0.9 % IV SOLN
500.0000 mg | Freq: Once | INTRAVENOUS | Status: AC
  Administered 2021-11-19: 500 mg via INTRAVENOUS
  Filled 2021-11-19: qty 5

## 2021-11-19 MED ORDER — LISINOPRIL-HYDROCHLOROTHIAZIDE 10-12.5 MG PO TABS: 1.0000 | ORAL_TABLET | Freq: Every day | ORAL | Status: DC

## 2021-11-19 MED ORDER — GABAPENTIN 300 MG PO CAPS
300.0000 mg | ORAL_CAPSULE | Freq: Every day | ORAL | Status: DC
  Administered 2021-11-20 – 2021-11-24 (×5): 300 mg via ORAL
  Filled 2021-11-19 (×5): qty 1

## 2021-11-19 MED ORDER — HYDROCHLOROTHIAZIDE 12.5 MG PO TABS
12.5000 mg | ORAL_TABLET | Freq: Every day | ORAL | Status: DC
  Administered 2021-11-20: 12.5 mg via ORAL
  Filled 2021-11-19: qty 1

## 2021-11-19 MED ORDER — PRAVASTATIN SODIUM 20 MG PO TABS
20.0000 mg | ORAL_TABLET | Freq: Every day | ORAL | Status: DC
  Administered 2021-11-20 – 2021-11-24 (×5): 20 mg via ORAL
  Filled 2021-11-19 (×5): qty 1

## 2021-11-19 MED ORDER — HYDROCODONE-ACETAMINOPHEN 5-325 MG PO TABS: 1.0000 | ORAL_TABLET | ORAL | Status: DC | PRN

## 2021-11-19 MED ORDER — METOPROLOL TARTRATE 5 MG/5ML IV SOLN: 5.0000 mg | INTRAVENOUS | Status: DC | PRN

## 2021-11-19 MED ORDER — SODIUM CHLORIDE 0.9 % IV BOLUS
1000.0000 mL | Freq: Once | INTRAVENOUS | Status: AC
  Administered 2021-11-19: 1000 mL via INTRAVENOUS

## 2021-11-19 MED ORDER — POLYETHYLENE GLYCOL 3350 17 G PO PACK: 17.0000 g | PACK | Freq: Every day | ORAL | Status: DC | PRN

## 2021-11-19 MED ORDER — ACETAMINOPHEN 325 MG PO TABS
650.0000 mg | ORAL_TABLET | Freq: Four times a day (QID) | ORAL | Status: DC | PRN
  Administered 2021-11-20 – 2021-11-23 (×7): 650 mg via ORAL
  Filled 2021-11-19 (×7): qty 2

## 2021-11-19 MED ORDER — FENTANYL CITRATE PF 50 MCG/ML IJ SOSY: 12.5000 ug | PREFILLED_SYRINGE | INTRAMUSCULAR | Status: DC | PRN

## 2021-11-19 MED ORDER — LISINOPRIL 10 MG PO TABS
10.0000 mg | ORAL_TABLET | Freq: Every day | ORAL | Status: DC
  Administered 2021-11-20: 10 mg via ORAL
  Filled 2021-11-19: qty 1

## 2021-11-19 MED ORDER — ASPIRIN 81 MG PO TBEC
81.0000 mg | DELAYED_RELEASE_TABLET | Freq: Every day | ORAL | Status: DC
  Administered 2021-11-20 – 2021-11-24 (×5): 81 mg via ORAL
  Filled 2021-11-19 (×5): qty 1

## 2021-11-19 NOTE — Assessment & Plan Note (Addendum)
Continue statin. 

## 2021-11-19 NOTE — H&P (Addendum)
History and Physical    Patient: Ricky Gross:[id number] DOB: 1955-07-03 DOA: 11/19/2021 DOS: the patient was seen and examined on 11/19/2021 PCP: Marisue Ivan, MD  Patient coming from: Home  Chief Complaint:  Chief Complaint  Patient presents with   Dizziness   Fever   Hypotension   HPI: Ricky Gross is a 66 y.o. male with medical history significant of HTN, GERD, HLD, prediabetes who presents with a 4-day history of fevers to 102 and night sweats at home.  He is taken several COVID test which were all negative.  On admission he was noted to have an elevated lactic acid and procalcitonin.  He was given IV antibiotics and we were asked to admit.  The patient was also noted to have significant hyponatremia and acute kidney injury and some abnormal liver function testing on admission.  There is no good source for his fevers.  He denies cough, dysuria, back pain, sore throat, abdominal pain, nausea vomiting, diarrhea or constipation.  He does report headache and back pain with the fevers.  He has also had approximately a 20 pound weight loss over the last couple months that has been unintentional.  Review of Systems: As mentioned in the history of present illness. All other systems reviewed and are negative. Past Medical History:  Diagnosis Date   GERD (gastroesophageal reflux disease)    Hypertension    History reviewed. No pertinent surgical history. Social History:  reports that he has quit smoking. He has never used smokeless tobacco. He reports that he does not currently use alcohol. He reports that he does not currently use drugs.  No Known Allergies  History reviewed. No pertinent family history.  Prior to Admission medications   Medication Sig Start Date End Date Taking? Authorizing Provider  chlordiazePOXIDE (LIBRIUM) 5 MG capsule Take 1 capsule (5 mg total) by mouth 3 (three) times daily as needed for anxiety. 08/02/19   Charlynne Pander, MD    Physical  Exam: Vitals:   11/19/21 1151 11/19/21 1530 11/19/21 1600  BP: 101/62 139/69 (!) 133/106  Pulse: 97 97 98  Resp: 18 (!) 32 (!) 23  Temp: 99.4 F (37.4 C)    TempSrc: Oral    SpO2: 99% 99% 100%  Weight: 73.9 kg    Height: 5\' 5"  (1.651 m)     Physical Examination: General appearance - alert, well appearing, and ill-appearing Neck - supple, no significant adenopathy Chest - clear to auscultation, no wheezes, rales or rhonchi, symmetric air entry Heart - normal rate, regular rhythm, normal S1, S2, no murmurs, rubs, clicks or gallops Abdomen - soft, nontender, nondistended, no masses or organomegaly Back exam - full range of motion, no tenderness, palpable spasm or pain on motion, no CVA tenderness Musculoskeletal - no joint tenderness, deformity or swelling Extremities - peripheral pulses normal, no pedal edema, no clubbing or cyanosis Skin - normal coloration and turgor, no rashes, no suspicious skin lesions noted  Data Reviewed: Results for orders placed or performed during the hospital encounter of 11/19/21 (from the past 24 hour(s))  Comprehensive metabolic panel     Status: Abnormal   Collection Time: 11/19/21 11:53 AM  Result Value Ref Range   Sodium 125 (L) 135 - 145 mmol/L   Potassium 3.9 3.5 - 5.1 mmol/L   Chloride 88 (L) 98 - 111 mmol/L   CO2 24 22 - 32 mmol/L   Glucose, Bld 111 (H) 70 - 99 mg/dL   BUN 42 (H) 8 - 23  mg/dL   Creatinine, Ser 5.62 (H) 0.61 - 1.24 mg/dL   Calcium 8.7 (L) 8.9 - 10.3 mg/dL   Total Protein 7.9 6.5 - 8.1 g/dL   Albumin 4.2 3.5 - 5.0 g/dL   AST 88 (H) 15 - 41 U/L   ALT 72 (H) 0 - 44 U/L   Alkaline Phosphatase 122 38 - 126 U/L   Total Bilirubin 0.9 0.3 - 1.2 mg/dL   GFR, Estimated 29 (L) >60 mL/min   Anion gap 13 5 - 15  CBC with Differential     Status: Abnormal   Collection Time: 11/19/21 11:53 AM  Result Value Ref Range   WBC 6.0 4.0 - 10.5 K/uL   RBC 4.95 4.22 - 5.81 MIL/uL   Hemoglobin 14.3 13.0 - 17.0 g/dL   HCT 56.3 89.3 - 73.4 %    MCV 84.0 80.0 - 100.0 fL   MCH 28.9 26.0 - 34.0 pg   MCHC 34.4 30.0 - 36.0 g/dL   RDW 28.7 68.1 - 15.7 %   Platelets 113 (L) 150 - 400 K/uL   nRBC 0.0 0.0 - 0.2 %   Neutrophils Relative % 83 %   Neutro Abs 5.1 1.7 - 7.7 K/uL   Lymphocytes Relative 10 %   Lymphs Abs 0.6 (L) 0.7 - 4.0 K/uL   Monocytes Relative 5 %   Monocytes Absolute 0.3 0.1 - 1.0 K/uL   Eosinophils Relative 0 %   Eosinophils Absolute 0.0 0.0 - 0.5 K/uL   Basophils Relative 1 %   Basophils Absolute 0.0 0.0 - 0.1 K/uL   Immature Granulocytes 1 %   Abs Immature Granulocytes 0.04 0.00 - 0.07 K/uL  Lactic acid, plasma     Status: Abnormal   Collection Time: 11/19/21 12:33 PM  Result Value Ref Range   Lactic Acid, Venous 2.2 (HH) 0.5 - 1.9 mmol/L  Protime-INR     Status: None   Collection Time: 11/19/21 12:33 PM  Result Value Ref Range   Prothrombin Time 12.9 11.4 - 15.2 seconds   INR 1.0 0.8 - 1.2  APTT     Status: None   Collection Time: 11/19/21 12:33 PM  Result Value Ref Range   aPTT 31 24 - 36 seconds  Urinalysis, Complete w Microscopic Urine, Clean Catch     Status: Abnormal   Collection Time: 11/19/21 12:33 PM  Result Value Ref Range   Color, Urine YELLOW (A) YELLOW   APPearance HAZY (A) CLEAR   Specific Gravity, Urine 1.013 1.005 - 1.030   pH 5.0 5.0 - 8.0   Glucose, UA NEGATIVE NEGATIVE mg/dL   Hgb urine dipstick SMALL (A) NEGATIVE   Bilirubin Urine NEGATIVE NEGATIVE   Ketones, ur NEGATIVE NEGATIVE mg/dL   Protein, ur NEGATIVE NEGATIVE mg/dL   Nitrite NEGATIVE NEGATIVE   Leukocytes,Ua NEGATIVE NEGATIVE   RBC / HPF 0-5 0 - 5 RBC/hpf   WBC, UA 0-5 0 - 5 WBC/hpf   Bacteria, UA RARE (A) NONE SEEN   Squamous Epithelial / LPF 0-5 0 - 5   Mucus PRESENT    Hyaline Casts, UA PRESENT   Procalcitonin - Baseline     Status: None   Collection Time: 11/19/21 12:33 PM  Result Value Ref Range   Procalcitonin 1.30 ng/mL  SARS Coronavirus 2 by RT PCR (hospital order, performed in [facility] hospital lab)  *cepheid single result test* Urine, Clean Catch     Status: None   Collection Time: 11/19/21 12:33 PM   Specimen: Urine, Clean  Catch; Nasal Swab  Result Value Ref Range   SARS Coronavirus 2 by RT PCR NEGATIVE NEGATIVE  Lactic acid, plasma     Status: None   Collection Time: 11/19/21  5:55 PM  Result Value Ref Range   Lactic Acid, Venous 1.4 0.5 - 1.9 mmol/L  TSH     Status: None   Collection Time: 11/19/21  5:55 PM  Result Value Ref Range   TSH 1.359 0.350 - 4.500 uIU/mL   CT ABDOMEN PELVIS WO CONTRAST  Result Date: 11/19/2021 CLINICAL DATA:  Sepsis, dizziness, weakness and fever. EXAM: CT ABDOMEN AND PELVIS WITHOUT CONTRAST TECHNIQUE: Multidetector CT imaging of the abdomen and pelvis was performed following the standard protocol without IV contrast. RADIATION DOSE REDUCTION: This exam was performed according to the departmental dose-optimization program which includes automated exposure control, adjustment of the mA and/or kV according to patient size and/or use of iterative reconstruction technique. COMPARISON:  None Available. FINDINGS: Lower chest: Small hiatal hernia. Hepatobiliary: No focal liver abnormality is seen. No gallstones, gallbladder wall thickening, or biliary dilatation. Pancreas: Unremarkable. No pancreatic ductal dilatation or surrounding inflammatory changes. Spleen: Normal in size without focal abnormality. Adrenals/Urinary Tract: Adrenal glands are unremarkable. Kidneys are normal, without renal calculi, focal lesion, or hydronephrosis. Bladder is unremarkable. Stomach/Bowel: Bowel shows no evidence of obstruction, ileus, inflammation or lesion. The appendix is not well visualized. No free intraperitoneal air. Vascular/Lymphatic: Atherosclerosis of the abdominal aorta without evidence of aneurysm. No enlarged lymph nodes identified. Reproductive: Prostate is unremarkable. Other: No abdominal wall hernia or abnormality. No abdominopelvic ascites. Musculoskeletal: No acute or  significant osseous findings. IMPRESSION: 1. No acute findings in the abdomen or pelvis. 2. Small hiatal hernia. 3. Atherosclerosis of the abdominal aorta without evidence of aneurysm. Electronically Signed   By: Irish Lack M.D.   On: 11/19/2021 15:12   DG Chest 2 View  Result Date: 11/19/2021 CLINICAL DATA:  Shortness of breath. EXAM: CHEST - 2 VIEW COMPARISON:  Chest x-ray February 06, 2008. FINDINGS: The heart size and mediastinal contours are within normal limits. Nodular opacity at the lower lateral right lung probably represents a nipple shadow. Otherwise, both lungs are clear. No visible pleural effusions or pneumothorax. No acute osseous abnormality. IMPRESSION: 1. Nodular opacity at the lower lateral right lung probably represents a nipple shadow, but recommend repeat radiographs with nipple markers to confirm and exclude pulmonary nodule. 2. Otherwise, no active cardiopulmonary disease. Electronically Signed   By: Feliberto Harts M.D.   On: 11/19/2021 12:36     Assessment and Plan: * Sepsis (HCC) With elevated temp, tachycardia, acute kidney injury, and elevated lactic acid.  Given elevated procalcitonin and fever and trace blood in his urine we will treat with Rocephin as it is a kidney infection. Check urine culture, blood cultures, respiratory cultures does not have any overt signs of bacteremia or meningitis at this point. IV fluid resuscitation  Thrombocytopenia (HCC) Likely related to overall sepsis picture however given weight loss and night sweats, if does not rebound could consider outpatient work-up.  AKI (acute kidney injury) (HCC) Question related to hydration status Note avoid nephrotoxic agents IV fluid hydration Trend  Hyponatremia IV hydration Trend  Pure hypercholesterolemia Continue statin  Gastroesophageal reflux disease without esophagitis Continue PPI  Essential hypertension Continue lisinopril, HCTZ-however this does give him a chronic cough he  will address with his primary care as an outpatient.  Borderline diabetes mellitus Low-carb diet      Advance Care Planning:   Code Status:  Not on file Full  Consults: None  Family Communication: Wife at bedside  Severity of Illness: The appropriate patient status for this patient is OBSERVATION. Observation status is judged to be reasonable and necessary in order to provide the required intensity of service to ensure the patient's safety. The patient's presenting symptoms, physical exam findings, and initial radiographic and laboratory data in the context of their medical condition is felt to place them at decreased risk for further clinical deterioration. Furthermore, it is anticipated that the patient will be medically stable for discharge from the hospital within 2 midnights of admission.   Author: Reva Bores, MD 11/19/2021 4:13 PM  For on call review [email or web address].

## 2021-11-19 NOTE — ED Provider Notes (Addendum)
[facility] Provider Note    Event Date/Time   First MD Initiated Contact with Patient 11/19/21 1201     (approximate)   History   Dizziness, Fever, and Hypotension   HPI  Ricky Gross is a 66 y.o. male   history hypertension presents to the ER evaluation of generalized malaise as well as nightly fevers for the past week.  Feeling weak and like he is about to pass denies any chest pain has been having daily headaches.  No numbness or tingling.  No diarrhea.  Denies any dysuria or hematuria.  Denies any abdominal pain.  No back pain.      Physical Exam   Triage Vital Signs: ED Triage Vitals  Enc Vitals Group     BP 11/19/21 1151 101/62     Pulse Rate 11/19/21 1151 97     Resp 11/19/21 1151 18     Temp 11/19/21 1151 99.4 F (37.4 C)     Temp Source 11/19/21 1151 Oral     SpO2 11/19/21 1151 99 %     Weight 11/19/21 1151 163 lb (73.9 kg)     Height 11/19/21 1151 5\' 5"  (1.651 m)     Head Circumference --      Peak Flow --      Pain Score 11/19/21 1149 0     Pain Loc --      Pain Edu? --      Excl. in GC? --     Most recent vital signs: Vitals:   11/19/21 1151  BP: 101/62  Pulse: 97  Resp: 18  Temp: 99.4 F (37.4 C)  SpO2: 99%     Constitutional: Alert  Eyes: Conjunctivae are normal.  Head: Atraumatic. Nose: No congestion/rhinnorhea. Mouth/Throat: Mucous membranes are moist.   Neck: Painless ROM.  Cardiovascular:   Good peripheral circulation. Respiratory: Normal respiratory effort.  No retractions.  Gastrointestinal: Soft and nontender.  Musculoskeletal:  no deformity Neurologic:  MAE spontaneously. No gross focal neurologic deficits are appreciated.  Skin:  Skin is warm, dry and intact. No rash noted. Psychiatric: Mood and affect are normal. Speech and behavior are normal.    ED Results / Procedures / Treatments   Labs (all labs ordered are listed, but only abnormal results are displayed) Labs Reviewed  LACTIC ACID,  PLASMA - Abnormal; Notable for the following components:      Result Value   Lactic Acid, Venous 2.2 (*)    All other components within normal limits  COMPREHENSIVE METABOLIC PANEL - Abnormal; Notable for the following components:   Sodium 125 (*)    Chloride 88 (*)    Glucose, Bld 111 (*)    BUN 42 (*)    Creatinine, Ser 2.39 (*)    Calcium 8.7 (*)    AST 88 (*)    ALT 72 (*)    GFR, Estimated 29 (*)    All other components within normal limits  CBC WITH DIFFERENTIAL/PLATELET - Abnormal; Notable for the following components:   Platelets 113 (*)    Lymphs Abs 0.6 (*)    All other components within normal limits  URINALYSIS, COMPLETE (UACMP) WITH MICROSCOPIC - Abnormal; Notable for the following components:   Color, Urine YELLOW (*)    APPearance HAZY (*)    Hgb urine dipstick SMALL (*)    Bacteria, UA RARE (*)    All other components within normal limits  SARS CORONAVIRUS 2 BY RT PCR  CULTURE, BLOOD (ROUTINE X  2)  CULTURE, BLOOD (ROUTINE X 2)  PROTIME-INR  APTT  PROCALCITONIN  LACTIC ACID, PLASMA     EKG  ED ECG REPORT I, Willy Eddy, the attending physician, personally viewed and interpreted this ECG.   Date: 11/19/2021  EKG Time: 11:59  Rate: 90  Rhythm: sinus  Axis: normal  Intervals:normal  ST&T Change: no stemi, no depressions    RADIOLOGY Please see ED Course for my review and interpretation.  I personally reviewed all radiographic images ordered to evaluate for the above acute complaints and reviewed radiology reports and findings.  These findings were personally discussed with the patient.  Please see medical record for radiology report.    PROCEDURES:  Critical Care performed: Yes, see critical care procedure note(s)  .Critical Care  Performed by: Willy Eddy, MD Authorized by: Willy Eddy, MD   Critical care provider statement:    Critical care time (minutes):  35   Critical care was necessary to treat or prevent imminent  or life-threatening deterioration of the following conditions:  Metabolic crisis   Critical care was time spent personally by me on the following activities:  Ordering and performing treatments and interventions, ordering and review of laboratory studies, ordering and review of radiographic studies, pulse oximetry, re-evaluation of patient's condition, review of old charts, obtaining history from patient or surrogate, examination of patient, evaluation of patient's response to treatment, discussions with primary provider, discussions with consultants and development of treatment plan with patient or surrogate    MEDICATIONS ORDERED IN ED: Medications  0.9 %  sodium chloride infusion (has no administration in time range)  sodium chloride 0.9 % bolus 1,000 mL (1,000 mLs Intravenous New Bag/Given 11/19/21 1353)  cefTRIAXone (ROCEPHIN) 1 g in sodium chloride 0.9 % 100 mL IVPB (1 g Intravenous New Bag/Given 11/19/21 1352)     IMPRESSION / MDM / ASSESSMENT AND PLAN / ED COURSE  I reviewed the triage vital signs and the nursing notes.                              Differential diagnosis includes, but is not limited to, sepsis, dehydration, diarrhea, mass, enteritis, colitis, stone, bladder outlet obstruction pyelonephritis  Patient is under the ER for evaluation of dizziness night sweats generalized malaise as described above.  Patient is dehydrated and frail appearing exam otherwise unremarkable.  This presenting complaint could reflect a potentially life-threatening illness therefore the patient will be placed on continuous pulse oximetry and telemetry for monitoring.  Laboratory evaluation will be sent to evaluate for the above complaints.     Clinical Course as of 11/19/21 1604  Fri Nov 19, 2021  1229 Chest x-ray on my review interpretation does not show any evidence of consolidation or pneumothorax. [PR]  1541 Work-up with evidence of acute hyponatremia with AKI.  Lactate elevated.  Has had  hemoglobin with rare bacteria.  Procalcitonin is elevated.  CT imaging without acute intra-abdominal process.  Patient presentation will consult hospitalist for admission. [PR]    Clinical Course User Index [PR] Willy Eddy, MD     FINAL CLINICAL IMPRESSION(S) / ED DIAGNOSES   Final diagnoses:  Dehydration  Hyponatremia     Rx / DC Orders   ED Discharge Orders     None        Note:  This document was prepared using Dragon voice recognition software and may include unintentional dictation errors.    Willy Eddy, MD 11/19/21 [phone]  Willy Eddy, MD 11/19/21 [phone]

## 2021-11-19 NOTE — Plan of Care (Signed)
°  Problem: Coping: °Goal: Level of anxiety will decrease °Outcome: Progressing °  °

## 2021-11-19 NOTE — Assessment & Plan Note (Signed)
IV hydration Trend

## 2021-11-19 NOTE — ED Triage Notes (Addendum)
Pt to ED POV with wife for dizziness, weakness, fever and cold sweats since Tuesday (3d). Pt had temp of 103 on Tuesday. Wife gave ibuprofen and placed ice packs and temp was 101 next day. This morning temp was 101.7 (103 again yesterday), last antipyretic was 2 ibuprofen at 0600 today. Pt also SOB since Tuesday but denies urinary symptoms. Respirations are unlabored.   BP was 82/52 today at [facility] walk in clinic this morning. BP currently 101/62, MAP 70. 99% on RA. Pt had 2 negative home covid tests this week.

## 2021-11-19 NOTE — ED Notes (Signed)
Lab is aware of need for repeat lactic and second blood cultures

## 2021-11-19 NOTE — Assessment & Plan Note (Signed)
avoid nephrotoxic agents Continue IV fluid hydration   Lab Results  Component Value Date   CREATININE 2.39 (H) 11/19/2021   CREATININE 1.02 08/02/2019   CREATININE 1.00 08/02/2019

## 2021-11-19 NOTE — Assessment & Plan Note (Signed)
Low-carb diet.  Hemoglobin A1c 6.3. 

## 2021-11-19 NOTE — ED Notes (Signed)
Pt knows need for ua has urinal

## 2021-11-19 NOTE — Assessment & Plan Note (Signed)
Continue PPI ?

## 2021-11-19 NOTE — ED Notes (Signed)
First Nurse Note: Patient sent to ED from [facility]. Per PA, patient has been having fevers x3 days. Hypotensive at clinic w/ BP of 82/60. C/o dizziness and lightlessness.

## 2021-11-19 NOTE — Assessment & Plan Note (Signed)
Likely related to overall sepsis picture however given weight loss and night sweats, if does not rebound could consider outpatient work-up.

## 2021-11-19 NOTE — Assessment & Plan Note (Addendum)
With elevated temp, tachycardia, acute kidney injury, and elevated lactic acid.  Given elevated procalcitonin and fever and trace blood in his urine we will treat with Rocephin as it is a kidney infection. Check urine culture, blood cultures, respiratory cultures does not have any overt signs of bacteremia or meningitis at this point. IV fluid resuscitation

## 2021-11-19 NOTE — Assessment & Plan Note (Signed)
Continue lisinopril, HCTZ-however this does give him a chronic cough he will address with his primary care as an outpatient.

## 2021-11-20 DIAGNOSIS — E871 Hypo-osmolality and hyponatremia: Secondary | ICD-10-CM | POA: Diagnosis present

## 2021-11-20 DIAGNOSIS — R509 Fever, unspecified: Secondary | ICD-10-CM | POA: Diagnosis not present

## 2021-11-20 DIAGNOSIS — E860 Dehydration: Secondary | ICD-10-CM | POA: Diagnosis present

## 2021-11-20 DIAGNOSIS — I10 Essential (primary) hypertension: Secondary | ICD-10-CM

## 2021-11-20 DIAGNOSIS — E7800 Pure hypercholesterolemia, unspecified: Secondary | ICD-10-CM | POA: Diagnosis present

## 2021-11-20 DIAGNOSIS — R6520 Severe sepsis without septic shock: Secondary | ICD-10-CM | POA: Diagnosis not present

## 2021-11-20 DIAGNOSIS — D6959 Other secondary thrombocytopenia: Secondary | ICD-10-CM | POA: Diagnosis present

## 2021-11-20 DIAGNOSIS — I129 Hypertensive chronic kidney disease with stage 1 through stage 4 chronic kidney disease, or unspecified chronic kidney disease: Secondary | ICD-10-CM | POA: Diagnosis present

## 2021-11-20 DIAGNOSIS — B349 Viral infection, unspecified: Secondary | ICD-10-CM | POA: Diagnosis not present

## 2021-11-20 DIAGNOSIS — K219 Gastro-esophageal reflux disease without esophagitis: Secondary | ICD-10-CM | POA: Diagnosis present

## 2021-11-20 DIAGNOSIS — Z8042 Family history of malignant neoplasm of prostate: Secondary | ICD-10-CM | POA: Diagnosis not present

## 2021-11-20 DIAGNOSIS — Z87891 Personal history of nicotine dependence: Secondary | ICD-10-CM | POA: Diagnosis not present

## 2021-11-20 DIAGNOSIS — N1831 Chronic kidney disease, stage 3a: Secondary | ICD-10-CM | POA: Diagnosis present

## 2021-11-20 DIAGNOSIS — N179 Acute kidney failure, unspecified: Secondary | ICD-10-CM

## 2021-11-20 DIAGNOSIS — A419 Sepsis, unspecified organism: Secondary | ICD-10-CM | POA: Diagnosis not present

## 2021-11-20 DIAGNOSIS — G030 Nonpyogenic meningitis: Secondary | ICD-10-CM | POA: Diagnosis not present

## 2021-11-20 DIAGNOSIS — R7303 Prediabetes: Secondary | ICD-10-CM | POA: Diagnosis present

## 2021-11-20 DIAGNOSIS — I959 Hypotension, unspecified: Secondary | ICD-10-CM | POA: Diagnosis present

## 2021-11-20 DIAGNOSIS — Z20822 Contact with and (suspected) exposure to covid-19: Secondary | ICD-10-CM | POA: Diagnosis present

## 2021-11-20 DIAGNOSIS — R519 Headache, unspecified: Secondary | ICD-10-CM | POA: Diagnosis not present

## 2021-11-20 DIAGNOSIS — R7401 Elevation of levels of liver transaminase levels: Secondary | ICD-10-CM | POA: Diagnosis not present

## 2021-11-20 LAB — CBC
HCT: 39.1 % (ref 39.0–52.0)
Hemoglobin: 13.8 g/dL (ref 13.0–17.0)
MCH: 29.4 pg (ref 26.0–34.0)
MCHC: 35.3 g/dL (ref 30.0–36.0)
MCV: 83.2 fL (ref 80.0–100.0)
Platelets: 126 10*3/uL — ABNORMAL LOW (ref 150–400)
RBC: 4.7 MIL/uL (ref 4.22–5.81)
RDW: 13.2 % (ref 11.5–15.5)
WBC: 6 10*3/uL (ref 4.0–10.5)
nRBC: 0 % (ref 0.0–0.2)

## 2021-11-20 LAB — RESPIRATORY PANEL BY PCR

## 2021-11-20 LAB — COMPREHENSIVE METABOLIC PANEL
ALT: 61 U/L — ABNORMAL HIGH (ref 0–44)
AST: 85 U/L — ABNORMAL HIGH (ref 15–41)
Albumin: 3.2 g/dL — ABNORMAL LOW (ref 3.5–5.0)
Alkaline Phosphatase: 108 U/L (ref 38–126)
Anion gap: 13 (ref 5–15)
BUN: 33 mg/dL — ABNORMAL HIGH (ref 8–23)
CO2: 21 mmol/L — ABNORMAL LOW (ref 22–32)
Calcium: 8.8 mg/dL — ABNORMAL LOW (ref 8.9–10.3)
Chloride: 94 mmol/L — ABNORMAL LOW (ref 98–111)
Creatinine, Ser: 1.91 mg/dL — ABNORMAL HIGH (ref 0.61–1.24)
GFR, Estimated: 38 mL/min — ABNORMAL LOW (ref >60)
Glucose, Bld: 106 mg/dL — ABNORMAL HIGH (ref 70–99)
Potassium: 4.1 mmol/L (ref 3.5–5.1)
Sodium: 128 mmol/L — ABNORMAL LOW (ref 135–145)
Total Bilirubin: 0.9 mg/dL (ref 0.3–1.2)
Total Protein: 6.7 g/dL (ref 6.5–8.1)

## 2021-11-20 LAB — URINE CULTURE: Culture: NO GROWTH

## 2021-11-20 LAB — PROCALCITONIN: Procalcitonin: 1.75 ng/mL

## 2021-11-20 MED ORDER — DOXYCYCLINE HYCLATE 100 MG PO TABS
100.0000 mg | ORAL_TABLET | Freq: Two times a day (BID) | ORAL | Status: DC
  Administered 2021-11-20 – 2021-11-21 (×2): 100 mg via ORAL
  Filled 2021-11-20 (×2): qty 1

## 2021-11-20 NOTE — Hospital Course (Addendum)
66 y.o. male with medical history significant of HTN, GERD, HLD, prediabetes who presents with a 4-day history of fevers to 102 and night sweats at home  8/5-8/6 all infectious work-up has been negative thus far.  More ID work-up in progress 8/7: Neurology and nephro consult 8/8: Status post lumbar puncture.  Sodium improving.  8/9: Remained afebrile.  Meningitis/encephalitis panel negative, CSF culture negative in 24 hours.  CSF with mild pleocytosis with WBCs of 9, 77% neutrophil and mildly elevated protein at 80, consistent with aseptic meningitis.  CMV negative, Ehrlichia and Lyme serologies pending. Patient apparently took a cruise trip recently and wife find out that total of 9 people were sick with similar symptoms. No clear source of infection was found so sepsis ruled out.  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  Nephrology is recommending continuation of salt tablets and follow-up with primary care provider who can discontinue extra salt if appropriate.  Patient was also being given 4 more days of doxycycline.  He will continue with the rest of his home medications and follow-up with his providers.

## 2021-11-20 NOTE — Progress Notes (Signed)
   11/20/21 1552  Assess: MEWS Score  Temp 99 F (37.2 C)  BP 119/67  MAP (mmHg) 83  Pulse Rate (!) 114  Resp 18  Level of Consciousness Alert  SpO2 98 %  Assess: MEWS Score  MEWS Temp 0  MEWS Systolic 0  MEWS Pulse 2  MEWS RR 0  MEWS LOC 0  MEWS Score 2  MEWS Score Color Yellow  Assess: if the MEWS score is Yellow or Red  Were vital signs taken at a resting state? Yes  Focused Assessment No change from prior assessment  Does the patient meet 2 or more of the SIRS criteria? No  Does the patient have a confirmed or suspected source of infection? Yes  Provider and Rapid Response Notified? No  MEWS guidelines implemented *See Row Information* Yes  Treat  Pain Scale 0-10  Pain Score 0  Take Vital Signs  Increase Vital Sign Frequency  Yellow: Q 2hr X 2 then Q 4hr X 2, if remains yellow, continue Q 4hrs  Escalate  MEWS: Escalate Yellow: discuss with charge nurse/RN and consider discussing with provider and RRT  Notify: Charge Nurse/RN  Name of Charge Nurse/RN Notified Christy RN  Date Charge Nurse/RN Notified 11/20/21  Time Charge Nurse/RN Notified 1558  Notify: Provider  Provider Name/Title [facility]  Date Provider Notified 11/20/21  Time Provider Notified 1558  Method of Notification Page  Notification Reason Change in status  Assess: SIRS CRITERIA  SIRS Temperature  0  SIRS Pulse 1  SIRS Respirations  0  SIRS WBC 1  SIRS Score Sum  2   Patient tachycardic. MD notified and aware. Patient asymptomatic.  Lidia Collum, RN

## 2021-11-20 NOTE — Progress Notes (Addendum)
  Progress Note   Patient: Ricky Gross [id number]:[id number] DOB: 15-Jun-1955 DOA: 11/19/2021     0 DOS: the patient was seen and examined on 11/20/2021   Brief hospital course: 66 y.o. male with medical history significant of HTN, GERD, HLD, prediabetes who presents with a 4-day history of fevers to 102 and night sweats at home  8/5: Has remained afebrile in last 24 hours.  All infectious work-up has been negative thus far.   Assessment and Plan: * Sepsis (HCC) With elevated temp, tachycardia, acute kidney injury, and elevated lactic acid.  Given elevated procalcitonin and fever and trace blood in his urine empirically with Rocephin as it is a kidney infection. Negative urine culture, blood cultures, respiratory cultures does not have any overt signs of bacteremia or meningitis at this point. Continue IV fluid resuscitation Respiratory virus panel ordered but not performed yet.  We will check for [facility] spotted fever as he is out cutting grass all the time  Thrombocytopenia (HCC) Likely related to overall sepsis picture however given weight loss and night sweats, if does not rebound could consider outpatient work-up.  We will recheck CBC today  AKI (acute kidney injury) (HCC) avoid nephrotoxic agents Continue IV fluid hydration   Lab Results  Component Value Date   CREATININE 2.39 (H) 11/19/2021   CREATININE 1.02 08/02/2019   CREATININE 1.00 08/02/2019    Hyponatremia Continue IV hydration Trend  Pure hypercholesterolemia Continue statin.  Gastroesophageal reflux disease without esophagitis Continue PPI.  Essential hypertension Continue lisinopril, HCTZ-however this does give him a chronic cough he will address with his primary care as an outpatient.  Lisinopril may need to be stopped  Borderline diabetes mellitus Low-carb diet.  Hemoglobin A1c 6.3        Subjective: Still feeling tired.  Denies any fever, daughter at bedside  Physical Exam: Vitals:    11/19/21 1741 11/19/21 1959 11/20/21 0500 11/20/21 0935  BP: 116/74 137/74 (!) 97/53 117/61  Pulse: (!) 124 (!) 106 74 92  Resp: 19 18 15 16   Temp: 99.5 F (37.5 C) 99.8 F (37.7 C) 98.1 F (36.7 C) 97.9 F (36.6 C)  TempSrc: Oral  Oral Oral  SpO2: 99% 100% 95% 100%  Weight:      Height:       66 year old male lying in the bed comfortably without any acute distress Lungs clear to auscultation bilaterally Cardiovascular regular rate and rhythm Abdomen soft, benign Neuro alert and oriented, nonfocal Psych normal mood and affect Data Reviewed:  Hemoglobin A1c 6.3  Family Communication: Alicia/spouse updated at bedside  Disposition: Status is: Observation The patient remains OBS appropriate and will d/c before 2 midnights.  Planned Discharge Destination: Home    DVT prophylaxis-SCDs Time spent: 35 minutes  Author: 77, MD 11/20/2021 12:32 PM  For on call review [email or web address].

## 2021-11-20 NOTE — Final Progress Note (Addendum)
I had evaluated the patient earlier when the spouse was at bedside.  I had gone over his labs and other results and the management plan extensively with them at bedside.  They were agreeable with the plan.  15 minutes ago nurse reached out, that there is a family/brother at bedside now demanding immediate transfer to [facility].  I talked with brother over phone was not willing to listen anything then getting out to [facility].  I have made a request for [facility] transfer.  Request is pending per transfer coordinator this may have to go through administrative review as this is a family request.  I just received call from [facility] transfer center.  His transfer has been declined on their end.  Time spent 20 minutes

## 2021-11-21 DIAGNOSIS — R7303 Prediabetes: Secondary | ICD-10-CM | POA: Diagnosis not present

## 2021-11-21 DIAGNOSIS — N179 Acute kidney failure, unspecified: Secondary | ICD-10-CM | POA: Diagnosis not present

## 2021-11-21 DIAGNOSIS — A419 Sepsis, unspecified organism: Secondary | ICD-10-CM | POA: Diagnosis not present

## 2021-11-21 DIAGNOSIS — I10 Essential (primary) hypertension: Secondary | ICD-10-CM | POA: Diagnosis not present

## 2021-11-21 LAB — CBC
HCT: 34.6 % — ABNORMAL LOW (ref 39.0–52.0)
Hemoglobin: 12.2 g/dL — ABNORMAL LOW (ref 13.0–17.0)
MCH: 29.4 pg (ref 26.0–34.0)
MCHC: 35.3 g/dL (ref 30.0–36.0)
MCV: 83.4 fL (ref 80.0–100.0)
Platelets: 147 10*3/uL — ABNORMAL LOW (ref 150–400)
RBC: 4.15 MIL/uL — ABNORMAL LOW (ref 4.22–5.81)
RDW: 13.5 % (ref 11.5–15.5)
WBC: 4.6 10*3/uL (ref 4.0–10.5)
nRBC: 0 % (ref 0.0–0.2)

## 2021-11-21 LAB — BASIC METABOLIC PANEL
Anion gap: 10 (ref 5–15)
BUN: 28 mg/dL — ABNORMAL HIGH (ref 8–23)
CO2: 22 mmol/L (ref 22–32)
Calcium: 8.3 mg/dL — ABNORMAL LOW (ref 8.9–10.3)
Chloride: 95 mmol/L — ABNORMAL LOW (ref 98–111)
Creatinine, Ser: 1.84 mg/dL — ABNORMAL HIGH (ref 0.61–1.24)
GFR, Estimated: 40 mL/min — ABNORMAL LOW (ref >60)
Glucose, Bld: 104 mg/dL — ABNORMAL HIGH (ref 70–99)
Potassium: 4.2 mmol/L (ref 3.5–5.1)
Sodium: 127 mmol/L — ABNORMAL LOW (ref 135–145)

## 2021-11-21 LAB — HEPATIC FUNCTION PANEL
ALT: 65 U/L — ABNORMAL HIGH (ref 0–44)
AST: 91 U/L — ABNORMAL HIGH (ref 15–41)
Albumin: 3.2 g/dL — ABNORMAL LOW (ref 3.5–5.0)
Alkaline Phosphatase: 105 U/L (ref 38–126)
Bilirubin, Direct: 0.2 mg/dL (ref 0.0–0.2)
Indirect Bilirubin: 0.6 mg/dL (ref 0.3–0.9)
Total Bilirubin: 0.8 mg/dL (ref 0.3–1.2)
Total Protein: 6.7 g/dL (ref 6.5–8.1)

## 2021-11-21 LAB — PROCALCITONIN: Procalcitonin: 1.06 ng/mL

## 2021-11-21 MED ORDER — HYDROCODONE-ACETAMINOPHEN 5-325 MG PO TABS
1.0000 | ORAL_TABLET | ORAL | Status: DC | PRN
  Administered 2021-11-22: 1 via ORAL
  Filled 2021-11-21: qty 1

## 2021-11-21 MED ORDER — SODIUM CHLORIDE 0.9 % IV SOLN
100.0000 mg | Freq: Two times a day (BID) | INTRAVENOUS | Status: DC
  Administered 2021-11-21 – 2021-11-24 (×6): 100 mg via INTRAVENOUS
  Filled 2021-11-21 (×7): qty 100

## 2021-11-21 NOTE — Assessment & Plan Note (Signed)
Continue IV hydration ?

## 2021-11-21 NOTE — Progress Notes (Signed)
   11/20/21 1958  Assess: MEWS Score  Temp (!) 101.7 F (38.7 C)  BP (!) 142/70  MAP (mmHg) 90  Pulse Rate 98  ECG Heart Rate 96  Resp 20  Level of Consciousness Alert  SpO2 97 %  O2 Device Room Air  Patient Activity (if Appropriate) In bed  Assess: MEWS Score  MEWS Temp 2  MEWS Systolic 0  MEWS Pulse 0  MEWS RR 0  MEWS LOC 0  MEWS Score 2  MEWS Score Color Yellow  Assess: if the MEWS score is Yellow or Red  Were vital signs taken at a resting state? Yes  Focused Assessment No change from prior assessment  Does the patient meet 2 or more of the SIRS criteria? No  Does the patient have a confirmed or suspected source of infection? Yes  Provider and Rapid Response Notified? Yes  MEWS guidelines implemented *See Row Information* No, previously yellow, continue vital signs every 4 hours  Treat  MEWS Interventions Administered scheduled meds/treatments  Pain Scale 0-10  Pain Score 0  Take Vital Signs  Increase Vital Sign Frequency  Yellow: Q 2hr X 2 then Q 4hr X 2, if remains yellow, continue Q 4hrs  Escalate  MEWS: Escalate Yellow: discuss with charge nurse/RN and consider discussing with provider and RRT (previously yellow/ pt in/out of yellow mews)  Notify: Charge Nurse/RN  Name of Charge Nurse/RN Notified Raynelle Fanning, RN  Date Charge Nurse/RN Notified 11/20/21  Time Charge Nurse/RN Notified 2028  Notify: Provider  Provider Name/Title B. Jon Billings, NP  Date Provider Notified 11/20/21  Time Provider Notified 2228  Method of Notification Page  Notification Reason Other (Comment)  Provider response No new orders  Date of Provider Response 11/20/21  Time of Provider Response 2240  Document  Patient Outcome Stabilized after interventions  Progress note created (see row info) Yes  Assess: SIRS CRITERIA  SIRS Temperature  1  SIRS Pulse 1  SIRS Respirations  0  SIRS WBC 1  SIRS Score Sum  3

## 2021-11-21 NOTE — Assessment & Plan Note (Signed)
avoid nephrotoxic agents.  Improving with IV fluid hydration   Lab Results  Component Value Date   CREATININE 1.84 (H) 11/21/2021   CREATININE 1.91 (H) 11/20/2021   CREATININE 2.39 (H) 11/19/2021

## 2021-11-21 NOTE — Progress Notes (Signed)
     Patient ID: Ricky Gross, male   DOB: 10-Jul-1955, 66 y.o.   MRN: [id number]    11/21/21 1858  Vitals  Temp 99.1 F (37.3 C)  Temp Source Oral  ECG Heart Rate 92  MEWS COLOR  MEWS Score Color Green  MEWS Score  MEWS Temp 0  MEWS Systolic 0  MEWS Pulse 0  MEWS RR 0  MEWS LOC 0  MEWS Score 0   Ice packs removed.  Lidia Collum, RN

## 2021-11-21 NOTE — Assessment & Plan Note (Signed)
Stopping blood pressure medicine due to hypotension

## 2021-11-21 NOTE — Progress Notes (Signed)
Patient ID: Ricky Gross, male   DOB: [birth year], 66 y.o.   MRN: [id number]  Brother called front desk to report a raised bruise on left knee. MD informed.  Lidia Collum, RN

## 2021-11-21 NOTE — Progress Notes (Signed)
    Patient ID: Ricky Gross, male   DOB: 1955/09/10, 66 y.o.   MRN: [id number]    11/21/21 1747  Vitals  Temp (!) 101.7 F (38.7 C)  MEWS COLOR  MEWS Score Color Yellow  Pain Assessment  Pain Scale 0-10  Pain Score 0  MEWS Score  MEWS Temp 2  MEWS Systolic 0  MEWS Pulse 0  MEWS RR 0  MEWS LOC 0  MEWS Score 2  Provider Notification  Provider Name/Title Sherryll Burger  Date Provider Notified 11/21/21  Time Provider Notified 1748  Method of Notification Page   Ice packs placed axillary. MD notified. Charge nurse aware. Will continue to monitor.   Lidia Collum, RN

## 2021-11-21 NOTE — Assessment & Plan Note (Signed)
With elevated temp, tachycardia, acute kidney injury, and elevated lactic acid.  Given elevated procalcitonin and fever and trace blood in his urine empirically with Rocephin as it is a kidney infection. Negative urine culture, blood cultures, respiratory virus panel negative.  No overt signs of bacteremia or meningitis at this point. Continue IV fluid resuscitation Pending [facility] spotted fever, checking Lyme serology, hepatic panel and parasites per discussion with ID I added doxycycline yesterday empirically which will be switched to IV

## 2021-11-21 NOTE — Assessment & Plan Note (Signed)
Continue statin. 

## 2021-11-21 NOTE — Assessment & Plan Note (Signed)
Low-carb diet.  Hemoglobin A1c 6.3 

## 2021-11-21 NOTE — Assessment & Plan Note (Signed)
Continue PPI ?

## 2021-11-21 NOTE — Progress Notes (Signed)
Progress Note   Patient: Ricky Gross [id number]:[id number] DOB: 04/21/1955 DOA: 11/19/2021     1 DOS: the patient was seen and examined on 11/21/2021   Brief hospital course: 66 y.o. male with medical history significant of HTN, GERD, HLD, prediabetes who presents with a 4-day history of fevers to 102 and night sweats at home  8/5: All infectious work-up has been negative thus far. 8/6: Checking [facility] spotted fever, Lyme disease serology and hepatic function along with parasites considering travel history about 2 weeks ago.  ID consult   Assessment and Plan: * Sepsis (HCC) With elevated temp, tachycardia, acute kidney injury, and elevated lactic acid.  Given elevated procalcitonin and fever and trace blood in his urine empirically with Rocephin as it is a kidney infection. Negative urine culture, blood cultures, respiratory virus panel negative.  No overt signs of bacteremia or meningitis at this point. Continue IV fluid resuscitation Pending [facility] spotted fever, checking Lyme serology, hepatic panel and parasites per discussion with ID I added doxycycline yesterday empirically which will be switched to IV  Thrombocytopenia (HCC) Likely related to overall sepsis.  Platelets improving now     Latest Ref Rng & Units 11/21/2021    7:02 AM 11/20/2021   12:35 PM 11/19/2021   11:53 AM  CBC  WBC 4.0 - 10.5 K/uL 4.6  6.0  6.0   Hemoglobin 13.0 - 17.0 g/dL 67.8  93.8  10.1   Hematocrit 39.0 - 52.0 % 34.6  39.1  41.6   Platelets 150 - 400 K/uL 147  126  113     AKI (acute kidney injury) (HCC) avoid nephrotoxic agents.  Improving with IV fluid hydration   Lab Results  Component Value Date   CREATININE 1.84 (H) 11/21/2021   CREATININE 1.91 (H) 11/20/2021   CREATININE 2.39 (H) 11/19/2021    Hyponatremia Continue IV hydration   Pure hypercholesterolemia Continue statin  Gastroesophageal reflux disease without esophagitis Continue PPI  Essential hypertension Stopping  blood pressure medicine due to hypotension  Borderline diabetes mellitus Low-carb diet.  Hemoglobin A1c 6.3.        Subjective: Patient had a Tmax of 100.7 last evening.  He is still having intermittent chills.  Wife at bedside reporting [place] travel about 2 weeks ago (July 17-24) when they got off 1 day at [place] and 1 day at [place] but no reported sickness/illness until August 1.  He had headache at first followed by fever and chills.  Patient does report some headache but no neck stiffness or photophobia  Physical Exam: Vitals:   11/21/21 0000 11/21/21 0344 11/21/21 0500 11/21/21 0838  BP: (!) 96/55 102/67  111/74  Pulse: 92 98  81  Resp: 19 20 17 18   Temp: 98.9 F (37.2 C) (!) 100.7 F (38.2 C)  98.6 F (37 C)  TempSrc:  Oral  Oral  SpO2: 97% 97%  100%  Weight:      Height:       66 year old male lying in the bed comfortably without any acute distress Lungs clear to auscultation bilaterally Cardiovascular regular rate and rhythm Abdomen soft, benign Neuro alert and oriented, nonfocal Psych normal mood and affect GU: Normal exam Data Reviewed:  Sodium 127, creatinine 1.84  Family Communication: Wife updated at bedside  Disposition: Status is: Inpatient Remains inpatient appropriate because: Fever work-up   Planned Discharge Destination: Home    DVT prophylaxis-SCDs Time spent: 35 minutes  Author: 66, MD 11/21/2021 12:58 PM  For on call review [email or web address].

## 2021-11-21 NOTE — Assessment & Plan Note (Signed)
Likely related to overall sepsis.  Platelets improving now     Latest Ref Rng & Units 11/21/2021    7:02 AM 11/20/2021   12:35 PM 11/19/2021   11:53 AM  CBC  WBC 4.0 - 10.5 K/uL 4.6  6.0  6.0   Hemoglobin 13.0 - 17.0 g/dL 10.2  58.5  27.7   Hematocrit 39.0 - 52.0 % 34.6  39.1  41.6   Platelets 150 - 400 K/uL 147  126  113

## 2021-11-21 NOTE — Progress Notes (Signed)
ID PROGRESS NOTE   66yo M with hx of HTN admitted on 8/4 with dizziness, malaise and new onset fevers over the last week, found to have lab abn inc: hyponatremia, aki, mild transaminitis, decrease in platelets  and LA of 2.2. given fluids, and started on ceftriaxone/azithromycing. Work upf of blood cx NGTD, cxr not conclusive for infiltrate, CT abd/pelvis not showing evidence of infection. Has persistent fever, thus started on doxy on 8/5 evening. Clinical presentation could be consistent with tickborne illness   - agree with continue on doxycycling - if blood cx NGTD at 48-72hrs,m would d/c ceftriaxone - will change doxy oral to IV for now - will check RMSF and lyme - Dr Rivka Safer to see formally tomorrow  Aram Beecham B. Drue Second MD MPH [facility] [phone]

## 2021-11-22 ENCOUNTER — Inpatient Hospital Stay: Payer: Medicare HMO

## 2021-11-22 ENCOUNTER — Encounter: Payer: Self-pay | Admitting: Family Medicine

## 2021-11-22 DIAGNOSIS — R519 Headache, unspecified: Secondary | ICD-10-CM | POA: Diagnosis not present

## 2021-11-22 DIAGNOSIS — R7303 Prediabetes: Secondary | ICD-10-CM | POA: Diagnosis not present

## 2021-11-22 DIAGNOSIS — R509 Fever, unspecified: Secondary | ICD-10-CM | POA: Diagnosis not present

## 2021-11-22 DIAGNOSIS — E871 Hypo-osmolality and hyponatremia: Secondary | ICD-10-CM | POA: Diagnosis not present

## 2021-11-22 DIAGNOSIS — N179 Acute kidney failure, unspecified: Secondary | ICD-10-CM | POA: Diagnosis not present

## 2021-11-22 DIAGNOSIS — R7401 Elevation of levels of liver transaminase levels: Secondary | ICD-10-CM

## 2021-11-22 DIAGNOSIS — A419 Sepsis, unspecified organism: Secondary | ICD-10-CM | POA: Diagnosis not present

## 2021-11-22 DIAGNOSIS — I10 Essential (primary) hypertension: Secondary | ICD-10-CM | POA: Diagnosis not present

## 2021-11-22 LAB — COMPREHENSIVE METABOLIC PANEL
ALT: 56 U/L — ABNORMAL HIGH (ref 0–44)
AST: 76 U/L — ABNORMAL HIGH (ref 15–41)
Albumin: 2.8 g/dL — ABNORMAL LOW (ref 3.5–5.0)
Alkaline Phosphatase: 86 U/L (ref 38–126)
Anion gap: 10 (ref 5–15)
BUN: 20 mg/dL (ref 8–23)
CO2: 20 mmol/L — ABNORMAL LOW (ref 22–32)
Calcium: 8 mg/dL — ABNORMAL LOW (ref 8.9–10.3)
Chloride: 93 mmol/L — ABNORMAL LOW (ref 98–111)
Creatinine, Ser: 1.57 mg/dL — ABNORMAL HIGH (ref 0.61–1.24)
GFR, Estimated: 49 mL/min — ABNORMAL LOW (ref >60)
Glucose, Bld: 105 mg/dL — ABNORMAL HIGH (ref 70–99)
Potassium: 4.3 mmol/L (ref 3.5–5.1)
Sodium: 123 mmol/L — ABNORMAL LOW (ref 135–145)
Total Bilirubin: 0.9 mg/dL (ref 0.3–1.2)
Total Protein: 5.6 g/dL — ABNORMAL LOW (ref 6.5–8.1)

## 2021-11-22 LAB — CBC
HCT: 31 % — ABNORMAL LOW (ref 39.0–52.0)
Hemoglobin: 11 g/dL — ABNORMAL LOW (ref 13.0–17.0)
MCH: 29.5 pg (ref 26.0–34.0)
MCHC: 35.5 g/dL (ref 30.0–36.0)
MCV: 83.1 fL (ref 80.0–100.0)
Platelets: 170 10*3/uL (ref 150–400)
RBC: 3.73 MIL/uL — ABNORMAL LOW (ref 4.22–5.81)
RDW: 13.2 % (ref 11.5–15.5)
WBC: 4.4 10*3/uL (ref 4.0–10.5)
nRBC: 0 % (ref 0.0–0.2)

## 2021-11-22 LAB — ROCKY MTN SPOTTED FVR ABS PNL(IGG+IGM)
RMSF IgG: NEGATIVE
RMSF IgM: 0.26 index (ref 0.00–0.89)

## 2021-11-22 LAB — CORTISOL-AM, BLOOD: Cortisol - AM: 20.1 ug/dL (ref 6.7–22.6)

## 2021-11-22 LAB — LACTATE DEHYDROGENASE: LDH: 304 U/L — ABNORMAL HIGH (ref 98–192)

## 2021-11-22 LAB — HEPATITIS PANEL, ACUTE
HCV Ab: NONREACTIVE
Hep A IgM: NONREACTIVE
Hep B C IgM: NONREACTIVE
Hepatitis B Surface Ag: NONREACTIVE

## 2021-11-22 LAB — LYME DISEASE SEROLOGY W/REFLEX: Lyme Total Antibody EIA: NEGATIVE

## 2021-11-22 LAB — SODIUM, URINE, RANDOM: Sodium, Ur: 83 mmol/L

## 2021-11-22 MED ORDER — IOHEXOL 9 MG/ML PO SOLN
500.0000 mL | ORAL | Status: AC
  Administered 2021-11-22 (×2): 500 mL via ORAL

## 2021-11-22 MED ORDER — SODIUM CHLORIDE 0.9 % IV SOLN: INTRAVENOUS | Status: DC

## 2021-11-22 MED ORDER — IOHEXOL 300 MG/ML  SOLN
100.0000 mL | Freq: Once | INTRAMUSCULAR | Status: AC | PRN
  Administered 2021-11-22: 100 mL via INTRAVENOUS

## 2021-11-22 MED ORDER — SODIUM CHLORIDE 1 G PO TABS
1.0000 g | ORAL_TABLET | Freq: Three times a day (TID) | ORAL | Status: DC
Start: 2021-11-22 — End: 2021-11-24
  Administered 2021-11-22 – 2021-11-24 (×7): 1 g via ORAL
  Filled 2021-11-22 (×7): qty 1

## 2021-11-22 NOTE — Assessment & Plan Note (Signed)
Continue statin. 

## 2021-11-22 NOTE — Progress Notes (Signed)
Progress Note   Patient: Ricky Gross [id number]:[id number] DOB: 12/18/1955 DOA: 11/19/2021     2 DOS: the patient was seen and examined on 11/22/2021   Brief hospital course: 66 y.o. male with medical history significant of HTN, GERD, HLD, prediabetes who presents with a 4-day history of fevers to 102 and night sweats at home  8/5-8/6 all infectious work-up has been negative thus far.  More ID work-up in progress 8/7: Neurology and nephro consult   Assessment and Plan: * Sepsis (HCC) With elevated temp, tachycardia, acute kidney injury, and elevated lactic acid.  Given elevated procalcitonin and fever and trace blood in his urine empirically with Rocephin as it is a kidney infection. Negative urine culture, blood cultures, respiratory virus panel negative.  No overt signs of bacteremia or meningitis at this point. Continue IV fluid resuscitation Pending RMSF, Ehrlichia, CMV, Lyme serology, hepatic panel and parasites per discussion with ID Continue IV doxycycline and Rocephin empirically for now. Echo within normal limit.  Neurology consult for evaluation of meningitis.  He may need LP.  First needs to get CT head CT chest abdomen pelvis today showed no pathology Still running intermittent fever with chills.  Thrombocytopenia (HCC) Likely due to sepsis.  Platelets normalized     Latest Ref Rng & Units 11/22/2021    4:19 AM 11/21/2021    7:02 AM 11/20/2021   12:35 PM  CBC  WBC 4.0 - 10.5 K/uL 4.4  4.6  6.0   Hemoglobin 13.0 - 17.0 g/dL 70.2  63.7  85.8   Hematocrit 39.0 - 52.0 % 31.0  34.6  39.1   Platelets 150 - 400 K/uL 170  147  126     AKI (acute kidney injury) (HCC) avoid nephrotoxic agents.  Improving with IV fluid hydration.  Nephrology consultation   Lab Results  Component Value Date   CREATININE 1.57 (H) 11/22/2021   CREATININE 1.84 (H) 11/21/2021   CREATININE 1.91 (H) 11/20/2021    Hyponatremia We will consult nephrology for further evaluation as his sodium continues  to drop     Latest Ref Rng & Units 11/22/2021    4:19 AM 11/21/2021    7:02 AM 11/20/2021   12:35 PM  BMP  Glucose 70 - 99 mg/dL 850  277  412   BUN 8 - 23 mg/dL 20  28  33   Creatinine 0.61 - 1.24 mg/dL 8.78  6.76  7.20   Sodium 135 - 145 mmol/L 123  127  128   Potassium 3.5 - 5.1 mmol/L 4.3  4.2  4.1   Chloride 98 - 111 mmol/L 93  95  94   CO2 22 - 32 mmol/L 20  22  21    Calcium 8.9 - 10.3 mg/dL 8.0  8.3  8.8      Pure hypercholesterolemia Continue statin.  Gastroesophageal reflux disease without esophagitis Continue PPI.  Essential hypertension consider restarting blood pressure medicine if continues to trend up.  Monitor for now  Borderline diabetes mellitus Low-carb diet.  Hemoglobin A1c 6.3        Subjective: Tmax 101.1 this morning.  He continues to have chills and headache intermittently  Physical Exam: Vitals:   11/21/21 1858 11/21/21 2056 11/22/21 0603 11/22/21 0906  BP:  133/83 118/73 (!) 146/79  Pulse:  100 91 89  Resp:  16 18 20   Temp: 99.1 F (37.3 C) 99.1 F (37.3 C) (!) 101.1 F (38.4 C) 98.4 F (36.9 C)  TempSrc: Oral  Oral Oral  SpO2:  100% 97% 95%  Weight:      Height:       66 year old male lying in the bed comfortably without any acute distress Lungs clear to auscultation bilaterally Cardiovascular regular rate and rhythm Abdomen soft, benign Neuro alert and oriented, nonfocal Psych normal mood and affect GU: Normal exam Data Reviewed:  Sodium 123, creatinine 1.5  Family Communication: Wife updated at bedside  Disposition: Status is: Inpatient Remains inpatient appropriate because: Fever of unknown origin work-up in progress.  Neurology, nephrology and ID consult    Planned Discharge Destination: Home    DVT prophylaxis-SCDs Time spent: 35 minutes  Author: Delfino Lovett, MD 11/22/2021 2:32 PM  For on call review [email or web address].

## 2021-11-22 NOTE — Assessment & Plan Note (Signed)
We will consult nephrology for further evaluation as his sodium continues to drop     Latest Ref Rng & Units 11/22/2021    4:19 AM 11/21/2021    7:02 AM 11/20/2021   12:35 PM  BMP  Glucose 70 - 99 mg/dL 924  462  863   BUN 8 - 23 mg/dL 20  28  33   Creatinine 0.61 - 1.24 mg/dL 8.17  7.11  6.57   Sodium 135 - 145 mmol/L 123  127  128   Potassium 3.5 - 5.1 mmol/L 4.3  4.2  4.1   Chloride 98 - 111 mmol/L 93  95  94   CO2 22 - 32 mmol/L 20  22  21    Calcium 8.9 - 10.3 mg/dL 8.0  8.3  8.8

## 2021-11-22 NOTE — Assessment & Plan Note (Signed)
avoid nephrotoxic agents.  Improving with IV fluid hydration.  Nephrology consultation   Lab Results  Component Value Date   CREATININE 1.57 (H) 11/22/2021   CREATININE 1.84 (H) 11/21/2021   CREATININE 1.91 (H) 11/20/2021

## 2021-11-22 NOTE — Assessment & Plan Note (Signed)
With elevated temp, tachycardia, acute kidney injury, and elevated lactic acid.  Given elevated procalcitonin and fever and trace blood in his urine empirically with Rocephin as it is a kidney infection. Negative urine culture, blood cultures, respiratory virus panel negative.  No overt signs of bacteremia or meningitis at this point. Continue IV fluid resuscitation Pending RMSF, Ehrlichia, CMV, Lyme serology, hepatic panel and parasites per discussion with ID Continue IV doxycycline and Rocephin empirically for now. Echo within normal limit.  Neurology consult for evaluation of meningitis.  He may need LP.  First needs to get CT head CT chest abdomen pelvis today showed no pathology Still running intermittent fever with chills.

## 2021-11-22 NOTE — Progress Notes (Signed)
Patient's spouse, Sherron Mummert, requested this RN to remove patient password.  Updated patient chart and removed password.  Secretary made aware.

## 2021-11-22 NOTE — Assessment & Plan Note (Signed)
Low-carb diet.  Hemoglobin A1c 6.3

## 2021-11-22 NOTE — Assessment & Plan Note (Signed)
Continue PPI ?

## 2021-11-22 NOTE — Consult Note (Signed)
NAME: Ricky Gross  DOB: 05-10-1955  MRN: [id number]  Date/Time: 11/22/2021 8:57 AM  REQUESTING PROVIDER: Dr. Sherryll Burger Subjective:  REASON FOR CONSULT: Fever History from patient, wife and chart reviewed thoroughly  ? Ricky Gross is a 66 y.o. male with a recently diagnosed hypertension, prediabetes, hypercholesterolemia presented to the ED on 11/19/2021 with fever, chills, headaches Of nearly 4 days duration. Patient was recently on a  family carnival cruise  with 56 of his family members between July 17 until November 08, 2021.  He had gone to [place] for a few hours and then returned back to the boat and also to [place].  He does not remember being bit by any mosquitoes None of his family members were sick. He went on the water slide on the cruise and swallowed chlorine water and coughed a lot as per his wife.He was doing fine until August 4 when he was doing some flooring at his mother-in-law's house when he felt fatigued and had headache.  That evening his temperature was around 103.  He was given ibuprofen and cold packs.  The wife said his bed was drenched in sweat.  The next morning he felt a little better but had fever again in the evening of 102.4..  He was also having severe headache.  As this continued he went to the urgent care on 11/19/2021 and was asked to go to the ED. Vitals in the ED BP of 137/75 which was later 86/55, temperature 99.8, pulse 106, sats 100% respiratory rate 18.  WBC 6, Hb 14.3, platelet 113, creatinine 2.39.  Sodium 125.  AST 88, and ALT 72. UA had 0 WBC. Blood culture and urine culture was sent.  He was started on ceftriaxone. He had a CT of the abdomen and chest and pelvis that did not reveal anything abnormal As the fever was persisting he was started on doxycycline.  An ID was consulted over the weekend and tickborne illness work-up was sent.  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 During this carnival gross he did not eat any raw seafood. Did not explore any caves Did not get bit by animals or ticks He drinks 24 ounces of beer a day. As per his wife he has lost about 20 pounds.  Not sure whether it was intentional but she says he is a poor eater Past Medical History:  Diagnosis Date   GERD (gastroesophageal reflux disease)    Hypertension   Past surgical history Carpal tunnel repair  Family history Prostate cancer in father Ovarian cancer in mother Social History   Socioeconomic History   Marital status: Married    Spouse name: Not on file   Number of children: Not on file   Years of education: Not on file   Highest education level: Not on file  Occupational History   Not on file  Tobacco Use   Smoking status: Former   Smokeless tobacco: Never  Substance and Sexual Activity   Alcohol use: Not Currently   Drug use: Not Currently   Sexual activity: Not on file  Other Topics Concern   Not on file  Social History Narrative   Not on file   Social Determinants of Health   Financial Resource Strain: Not on file  Food Insecurity: Not on file  Transportation Needs: Not on file  Physical Activity: Not on file  Stress:  Not on file  Social Connections: Not on file  Intimate Partner Violence: Not on file    No Known Allergies I? Current Facility-Administered Medications  Medication Dose Route Frequency Provider Last Rate Last Admin   0.9 %  sodium chloride infusion   Intravenous Continuous Delfino Lovett, MD       acetaminophen (TYLENOL) tablet 650 mg  650 mg Oral Q6H PRN Reva Bores, MD   650 mg at 11/21/21 2252   aspirin EC tablet 81 mg  81 mg Oral Daily Reva Bores, MD   81 mg at 11/22/21 0846   cefTRIAXone (ROCEPHIN) 2 g in sodium chloride 0.9 % 100 mL IVPB  2 g Intravenous Q24H Reva Bores, MD   Stopped at 11/21/21 1355   doxycycline (VIBRAMYCIN) 100 mg in sodium chloride 0.9 % 250 mL IVPB  100 mg Intravenous  Carlene Coria, MD 125 mL/hr at 11/22/21 2353 Infusion Verify at 11/22/21 0643   gabapentin (NEURONTIN) capsule 300 mg  300 mg Oral Daily Foye Deer, RPH   300 mg at 11/21/21 1538   gabapentin (NEURONTIN) capsule 600 mg  600 mg Oral BID Foye Deer, RPH   600 mg at 11/22/21 0846   HYDROcodone-acetaminophen (NORCO/VICODIN) 5-325 MG per tablet 1-2 tablet  1-2 tablet Oral Q4H PRN Delfino Lovett, MD   1 tablet at 11/22/21 0251   metoprolol tartrate (LOPRESSOR) injection 5 mg  5 mg Intravenous Q5 min PRN Reva Bores, MD       pantoprazole (PROTONIX) EC tablet 40 mg  40 mg Oral Daily Reva Bores, MD   40 mg at 11/22/21 0846   polyethylene glycol (MIRALAX / GLYCOLAX) packet 17 g  17 g Oral Daily PRN Reva Bores, MD       pravastatin (PRAVACHOL) tablet 20 mg  20 mg Oral q1800 Reva Bores, MD   20 mg at 11/21/21 1732     Abtx:  Anti-infectives (From admission, onward)    Start     Dose/Rate Route Frequency Ordered Stop   11/21/21 1700  doxycycline (VIBRAMYCIN) 100 mg in sodium chloride 0.9 % 250 mL IVPB        100 mg 125 mL/hr over 120 Minutes Intravenous Every 12 hours 11/21/21 1105     11/20/21 2200  doxycycline (VIBRA-TABS) tablet 100 mg  Status:  Discontinued        100 mg Oral Every 12 hours 11/20/21 1929 11/21/21 1104   11/20/21 1200  cefTRIAXone (ROCEPHIN) 2 g in sodium chloride 0.9 % 100 mL IVPB        2 g 200 mL/hr over 30 Minutes Intravenous Every 24 hours 11/19/21 1747     11/19/21 1615  azithromycin (ZITHROMAX) 500 mg in sodium chloride 0.9 % 250 mL IVPB        500 mg 250 mL/hr over 60 Minutes Intravenous  Once 11/19/21 1607 11/19/21 2122   11/19/21 1345  cefTRIAXone (ROCEPHIN) 1 g in sodium chloride 0.9 % 100 mL IVPB        1 g 200 mL/hr over 30 Minutes Intravenous  Once 11/19/21 1330 11/19/21 1611       REVIEW OF SYSTEMS:  Const:  fever,  chills,  weight loss, headaches Eyes: negative diplopia or visual changes, negative eye pain ENT: negative coryza,  negative sore throat Resp: negative cough, hemoptysis, dyspnea Cards: negative for chest pain, palpitations, lower extremity edema GU: negative for frequency, dysuria and hematuria GI: Negative for abdominal pain, diarrhea,  bleeding, constipation Skin: negative for rash and pruritus Heme: negative for easy bruising and gum/nose bleeding MS: negative for myalgias, arthralgias, back pain and muscle weakness Neurolo:negative for headaches, dizziness, vertigo, memory problems  Psych: negative for feelings of anxiety, depression  Endocrine: pre diabetes Allergy/Immunology- negative for any medication or food allergies  Objective:  VITALS:  BP 118/73 (BP Location: Right Arm)   Pulse 91   Temp (!) 101.1 F (38.4 C) (Oral)   Resp 18   Ht 5\' 5"  (1.651 m)   Wt 73.9 kg   SpO2 97%   BMI 27.12 kg/m    PHYSICAL EXAM:  General: Alert, cooperative, some distress, appears stated age.  Head: Frontoparietal bossing , atraumatic. Eyes: Conjunctivae clear, anicteric sclerae. Pupils are equal ENT Nares normal. No drainage or sinus tenderness. Lips, mucosa, and tongue normal. No Thrush Neck:some stiffness, symmetrical, no adenopathy, thyroid: non tender no carotid bruit and no JVD. Back: No CVA tenderness. Lungs: Clear to auscultation bilaterally. No Wheezing or Rhonchi. No rales. Heart: Regular rate and rhythm, no murmur, rub or gallop. Abdomen: Soft, non-tender,not distended. Bowel sounds normal. No masses Extremities: atraumatic, no cyanosis. No edema. No clubbing Skin: No rashes or lesions. Or bruising Lymph: Cervical, supraclavicular normal. Neurologic: Grossly non-focal Pertinent Labs Lab Results CBC    Component Value Date/Time   WBC 4.4 11/22/2021 0419   RBC 3.73 (L) 11/22/2021 0419   HGB 11.0 (L) 11/22/2021 0419   HCT 31.0 (L) 11/22/2021 0419   PLT 170 11/22/2021 0419   MCV 83.1 11/22/2021 0419   MCH 29.5 11/22/2021 0419   MCHC 35.5 11/22/2021 0419   RDW 13.2 11/22/2021 0419    LYMPHSABS 0.6 (L) 11/19/2021 1153   MONOABS 0.3 11/19/2021 1153   EOSABS 0.0 11/19/2021 1153   BASOSABS 0.0 11/19/2021 1153       Latest Ref Rng & Units 11/22/2021    4:19 AM 11/21/2021   11:54 AM 11/21/2021    7:02 AM  CMP  Glucose 70 - 99 mg/dL 952105   841104   BUN 8 - 23 mg/dL 20   28   Creatinine 3.240.61 - 1.24 mg/dL 4.011.57   0.271.84   Sodium 253135 - 145 mmol/L 123   127   Potassium 3.5 - 5.1 mmol/L 4.3   4.2   Chloride 98 - 111 mmol/L 93   95   CO2 22 - 32 mmol/L 20   22   Calcium 8.9 - 10.3 mg/dL 8.0   8.3   Total Protein 6.5 - 8.1 g/dL 5.6  6.7    Total Bilirubin 0.3 - 1.2 mg/dL 0.9  0.8    Alkaline Phos 38 - 126 U/L 86  105    AST 15 - 41 U/L 76  91    ALT 0 - 44 U/L 56  65        Microbiology: Recent Results (from the past 240 hour(s))  Blood culture (routine x 2)     Status: None (Preliminary result)   Collection Time: 11/19/21 12:33 PM   Specimen: BLOOD  Result Value Ref Range Status   Specimen Description BLOOD LEFT ANTECUBITAL  Final   Special Requests   Final    BOTTLES DRAWN AEROBIC AND ANAEROBIC Blood Culture adequate volume   Culture   Final    NO GROWTH 3 DAYS Performed at [facility], [address] [place], [place] [id number]    Report Status PENDING  Incomplete  SARS Coronavirus 2 by RT PCR (hospital order, performed  in [facility] hospital lab) *cepheid single result test* Urine, Clean Catch     Status: None   Collection Time: 11/19/21 12:33 PM   Specimen: Urine, Clean Catch; Nasal Swab  Result Value Ref Range Status   SARS Coronavirus 2 by RT PCR NEGATIVE NEGATIVE Final    Comment: (NOTE) SARS-CoV-2 target nucleic acids are NOT DETECTED.  The SARS-CoV-2 RNA is generally detectable in upper and lower respiratory specimens during the acute phase of infection. The lowest concentration of SARS-CoV-2 viral copies this assay can detect is 250 copies / mL. A negative result does not preclude SARS-CoV-2 infection and should not be used as the sole basis  for treatment or other patient management decisions.  A negative result may occur with improper specimen collection / handling, submission of specimen other than nasopharyngeal swab, presence of viral mutation(s) within the areas targeted by this assay, and inadequate number of viral copies (<250 copies / mL). A negative result must be combined with clinical observations, patient history, and epidemiological information.  Fact Sheet for Patients:   RoadLapTop.co.za  Fact Sheet for Healthcare Providers: http://kim-miller.com/  This test is not yet approved or  cleared by the [place] FDA and has been authorized for detection and/or diagnosis of SARS-CoV-2 by FDA under an Emergency Use Authorization (EUA).  This EUA will remain in effect (meaning this test can be used) for the duration of the COVID-19 declaration under Section 564(b)(1) of the Act, 21 U.S.C. section 360bbb-3(b)(1), unless the authorization is terminated or revoked sooner.  Performed at [facility], [address] [place], [place] [address]   Urine Culture     Status: None   Collection Time: 11/19/21 12:33 PM   Specimen: Urine, Clean Catch  Result Value Ref Range Status   Specimen Description   Final    URINE, CLEAN CATCH Performed at [facility], [address], [place], [place] [address]    Special Requests   Final    NONE Performed at [facility], [address], [place], [place] [address]    Culture   Final    NO GROWTH Performed at [facility], [address], [place], [place] [address]    Report Status 11/20/2021 FINAL  Final  Blood culture (routine x 2)     Status: None (Preliminary result)   Collection Time: 11/19/21  3:45 PM   Specimen: BLOOD RIGHT HAND  Result Value Ref Range Status   Specimen Description BLOOD RIGHT HAND  Final   Special Requests   Final    BOTTLES DRAWN AEROBIC AND ANAEROBIC Blood Culture  adequate volume   Culture   Final    NO GROWTH 3 DAYS Performed at [facility], [address] [place], [place] [address]    Report Status PENDING  Incomplete  Respiratory (~20 pathogens) panel by PCR     Status: None   Collection Time: 11/20/21 10:30 AM   Specimen: Nasopharyngeal Swab; Respiratory  Result Value Ref Range Status   Adenovirus NOT DETECTED NOT DETECTED Final   Coronavirus 229E NOT DETECTED NOT DETECTED Final    Comment: (NOTE) The Coronavirus on the Respiratory Panel, DOES NOT test for the novel  Coronavirus (2019 nCoV)    Coronavirus HKU1 NOT DETECTED NOT DETECTED Final   Coronavirus NL63 NOT DETECTED NOT DETECTED Final   Coronavirus OC43 NOT DETECTED NOT DETECTED Final   Metapneumovirus NOT DETECTED NOT DETECTED Final   Rhinovirus / Enterovirus NOT DETECTED NOT DETECTED Final  Influenza A NOT DETECTED NOT DETECTED Final   Influenza B NOT DETECTED NOT DETECTED Final   Parainfluenza Virus 1 NOT DETECTED NOT DETECTED Final   Parainfluenza Virus 2 NOT DETECTED NOT DETECTED Final   Parainfluenza Virus 3 NOT DETECTED NOT DETECTED Final   Parainfluenza Virus 4 NOT DETECTED NOT DETECTED Final   Respiratory Syncytial Virus NOT DETECTED NOT DETECTED Final   Bordetella pertussis NOT DETECTED NOT DETECTED Final   Bordetella Parapertussis NOT DETECTED NOT DETECTED Final   Chlamydophila pneumoniae NOT DETECTED NOT DETECTED Final   Mycoplasma pneumoniae NOT DETECTED NOT DETECTED Final    Comment: Performed at [facility], [address] [place], [place] [address]    IMAGING RESULTS:  I have personally reviewed the films ? Impression/Recommendation 66 year old male presenting with fever, headache, chills, sweats, of 4 days duration following a week after cruise  Patient has no focal signs except headache and some stiffness of the neck secondary to that He has normal white count, normal hemoglobin Has low platelet which is improved mildly elevated AST  and ALT and hyponatremia. Differential diagnosis includes tickborne illness ( ehrlichia,  Viral fever including dengue Possible malaria infection HE could have aseptic meningitis But unlike to be bacterial  Patient is currently on IV ceftriaxone and IV doxycycline Recommend neuro consult Will send labs for all of the above  Hyponatremia this is related to the primary cause above Or other causes Rule out SIADH Check cortisol level. Discussed the management with patient and wife in detail  ? ___________________________________________________ Discussed with requesting provider Discussed with neurologist CT scan ordered- possible LP after that Note:  This document was prepared using Dragon voice recognition software and may include unintentional dictation errors.

## 2021-11-22 NOTE — TOC CM/SW Note (Signed)
  Transition of Care [facility]) Screening Note   Patient Details  Name: Ricky Gross Date of Birth: 1955/07/03   Transition of Care [facility]) CM/SW Contact:    Margarito Liner, LCSW Phone Number: 11/22/2021, 10:59 AM    Transition of Care Department [facility]) has reviewed patient and no TOC needs have been identified at this time. We will continue to monitor patient advancement through interdisciplinary progression rounds. If new patient transition needs arise, please place a TOC consult.

## 2021-11-22 NOTE — Consult Note (Signed)
Neurology Consultation Reason for Consult: Headache Referring Physician: Karlene Lineman  CC: Headache  History is obtained from: Patient  HPI: Ricky Gross is a 66 y.o. male with a history of hypertension who presents with headache, fevers, night sweats.  COVID test were negative.  He was noted to have AKI, abnormal LFTs.  He has been having headache and back pains that started the same day as the fevers, Tuesday.  He denies neck pain currently.  He denies photophobia.  He denies any history of similar headaches in the past.    ROS: A 14 point ROS was performed and is negative except as noted in the HPI.   Past Medical History:  Diagnosis Date   GERD (gastroesophageal reflux disease)    Hypertension      History reviewed. No pertinent family history.   Social History:  reports that he has quit smoking. He has never used smokeless tobacco. He reports that he does not currently use alcohol. He reports that he does not currently use drugs.   Exam: Current vital signs: BP (!) 146/79   Pulse 89   Temp 98.4 F (36.9 C) (Oral)   Resp 20   Ht 5\' 5"  (1.651 m)   Wt 73.9 kg   SpO2 95%   BMI 27.12 kg/m  Vital signs in last 24 hours: Temp:  [98.4 F (36.9 C)-101.7 F (38.7 C)] 98.4 F (36.9 C) (08/07 0906) Pulse Rate:  [89-100] 89 (08/07 0906) Resp:  [16-20] 20 (08/07 0906) BP: (118-146)/(73-83) 146/79 (08/07 0906) SpO2:  [95 %-100 %] 95 % (08/07 0906)   Physical Exam  Constitutional: Appears well-developed and well-nourished.  Psych: Affect appropriate to situation Eyes: No scleral injection HENT: No OP obstruction MSK: no joint deformities.  Cardiovascular: Normal rate and regular rhythm.  Respiratory: Effort normal, non-labored breathing GI: Soft.  No distension. There is no tenderness.  Skin: WDI  Neuro: Mental Status: Patient is awake, alert, oriented to person, place, month, year, and situation. Patient is able to give a clear and coherent history. No signs of  aphasia or neglect Cranial Nerves: II: Visual Fields are full.  III,IV, VI: EOMI without ptosis or diploplia.  V: Facial sensation is symmetric to temperature VII: Facial movement is symmetric.  VIII: hearing is intact to voice X: Uvula elevates symmetrically XI: Shoulder shrug is symmetric. XII: tongue is midline without atrophy or fasciculations.  Motor: Tone is normal. Bulk is normal. 5/5 strength was present in all four extremities.  Sensory: Sensation is symmetric to light touch and temperature in the arms and legs. Plantars: Toes are downgoing bilaterally.  Cerebellar: No ataxia on finger-nose-finger     I have reviewed labs in epic and the results pertinent to this consultation are: Sodium 123 Creatinine 1.57 Hemoglobin 11 Mildly elevated LFTs AST is 91, ALT is 65 Procalcitonin 1.06, down from 1.75 the day before Lyme negative   Impression: 66 year old male with fevers of unclear origin and severe new onset headaches.  Aseptic meningitis is certainly a possibility, though he does not have any clear signs of meningismus.  I do think it would be reasonable to check via lumbar puncture, but would first like to ensure no other structural causes of headache with a head CT to rule out mass lesion, etc.  Recommendations: 1) CT head 2) no acute findings, will plan to do LP for cells, glucose, protein, cultures 3) neurology will follow up   76, MD [facility] [phone]  If 7pm- 7am, please page  neurology on call as listed in [place].

## 2021-11-22 NOTE — Assessment & Plan Note (Addendum)
consider restarting blood pressure medicine if continues to trend up.  Monitor for now

## 2021-11-22 NOTE — Assessment & Plan Note (Signed)
Likely due to sepsis.  Platelets normalized     Latest Ref Rng & Units 11/22/2021    4:19 AM 11/21/2021    7:02 AM 11/20/2021   12:35 PM  CBC  WBC 4.0 - 10.5 K/uL 4.4  4.6  6.0   Hemoglobin 13.0 - 17.0 g/dL 51.0  25.8  52.7   Hematocrit 39.0 - 52.0 % 31.0  34.6  39.1   Platelets 150 - 400 K/uL 170  147  126

## 2021-11-22 NOTE — Consult Note (Signed)
[facility]  CONSULT NOTE    Date: 11/22/2021                  Patient Name:  Ricky Gross  MRN: [id number]  DOB: 08-01-1955  Age / Sex: 66 y.o., male         PCP: Dion Body, MD                 Service Requesting Consult: [place]                 Reason for Consult: Hyponatremia            History of Present Illness: Mr. Ricky Gross is a 66 y.o.  male with past medical history including hyperlipidemia, hypertension, GERD, and prediabetes, who was admitted to [facility] on 11/19/2021 for Dehydration [E86.0] Hyponatremia [E87.1] Sepsis ([place]) [A41.9]  Patient presents to the ED with a 4-day history of fever and night sweats.  Fever reported to be 102 F.  Patient denies sick contacts.  States he has had progressive fatigue.  Denies nausea, vomiting, or diarrhea.  Denies chest or abdominal pain.  Does endorse poor appetite, chart review reveals 20 pound weight loss over the past couple months.  Patient has taken COVID test at home which have been negative.   ED arrival clued sodium 125, glucose 111, serum bicarb 42, creatinine 2.39 with GFR 29, lactic acid 2.2.  Chest x-ray shows negative for acute findings.  CT abdomen pelvis negative for renal obstruction.  Expanded respiratory panel negative.  CT chest negative for lung masses or pneumonia.  Patient given antibiotics and IV fluid in emergency department.   Medications: Outpatient medications: Medications Prior to Admission  Medication Sig Dispense Refill Last Dose   aspirin EC 81 MG tablet Take 1 tablet by mouth daily.   11/19/2021   gabapentin (NEURONTIN) 300 MG capsule Take 1-2 capsules by mouth 3 (three) times daily.  2 po q am, 1 po q afternoon, 2 po q hs   11/19/2021   lisinopril-hydrochlorothiazide (ZESTORETIC) 10-12.5 MG tablet Take 1 tablet by mouth daily.   11/19/2021   lovastatin (MEVACOR) 20 MG tablet Take 1 tablet by mouth at bedtime.   11/18/2021   omeprazole (PRILOSEC) 20 MG capsule Take 20 mg by  mouth daily.   11/19/2021   chlordiazePOXIDE (LIBRIUM) 5 MG capsule Take 1 capsule (5 mg total) by mouth 3 (three) times daily as needed for anxiety. (Patient not taking: Reported on 11/19/2021) 10 capsule 0 Not Taking    Current medications: Current Facility-Administered Medications  Medication Dose Route Frequency Provider Last Rate Last Admin   0.9 %  sodium chloride infusion   Intravenous Continuous Max Sane, MD 100 mL/hr at 11/22/21 1114 New Bag at 11/22/21 1114   acetaminophen (TYLENOL) tablet 650 mg  650 mg Oral Q6H PRN Donnamae Jude, MD   650 mg at 11/22/21 1113   aspirin EC tablet 81 mg  81 mg Oral Daily Donnamae Jude, MD   81 mg at 11/22/21 0846   cefTRIAXone (ROCEPHIN) 2 g in sodium chloride 0.9 % 100 mL IVPB  2 g Intravenous Q24H Donnamae Jude, MD 200 mL/hr at 11/22/21 1116 2 g at 11/22/21 1116   doxycycline (VIBRAMYCIN) 100 mg in sodium chloride 0.9 % 250 mL IVPB  100 mg Intravenous Q12H Carlyle Basques, MD 125 mL/hr at 11/22/21 0643 Infusion Verify at 11/22/21 0643   gabapentin (NEURONTIN) capsule 300 mg  300 mg Oral Daily  Vira Blanco, RPH   300 mg at 11/21/21 1538   gabapentin (NEURONTIN) capsule 600 mg  600 mg Oral BID Vira Blanco, RPH   600 mg at 11/22/21 0846   HYDROcodone-acetaminophen (NORCO/VICODIN) 5-325 MG per tablet 1-2 tablet  1-2 tablet Oral Q4H PRN Max Sane, MD   1 tablet at 11/22/21 0251   metoprolol tartrate (LOPRESSOR) injection 5 mg  5 mg Intravenous Q5 min PRN Donnamae Jude, MD       pantoprazole (PROTONIX) EC tablet 40 mg  40 mg Oral Daily Donnamae Jude, MD   40 mg at 11/22/21 0846   polyethylene glycol (MIRALAX / GLYCOLAX) packet 17 g  17 g Oral Daily PRN Donnamae Jude, MD       pravastatin (PRAVACHOL) tablet 20 mg  20 mg Oral q1800 Donnamae Jude, MD   20 mg at 11/21/21 1732      Allergies: No Known Allergies    Past Medical History: Past Medical History:  Diagnosis Date   GERD (gastroesophageal reflux disease)    Hypertension       Past Surgical History: History reviewed. No pertinent surgical history.   Family History: History reviewed. No pertinent family history.   Social History: Social History   Socioeconomic History   Marital status: Married    Spouse name: Not on file   Number of children: Not on file   Years of education: Not on file   Highest education level: Not on file  Occupational History   Not on file  Tobacco Use   Smoking status: Former   Smokeless tobacco: Never  Substance and Sexual Activity   Alcohol use: Not Currently   Drug use: Not Currently   Sexual activity: Not on file  Other Topics Concern   Not on file  Social History Narrative   Not on file   Social Determinants of Health   Financial Resource Strain: Not on file  Food Insecurity: Not on file  Transportation Needs: Not on file  Physical Activity: Not on file  Stress: Not on file  Social Connections: Not on file  Intimate Partner Violence: Not on file     Review of Systems: Review of Systems  Constitutional:  Positive for diaphoresis, fever and malaise/fatigue. Negative for chills.  HENT:  Negative for congestion, sore throat and tinnitus.   Eyes:  Negative for blurred vision and redness.  Respiratory:  Negative for cough, shortness of breath and wheezing.   Cardiovascular:  Negative for chest pain, palpitations, claudication and leg swelling.  Gastrointestinal:  Negative for abdominal pain, blood in stool, diarrhea, nausea and vomiting.  Genitourinary:  Negative for flank pain, frequency and hematuria.  Musculoskeletal:  Negative for back pain, falls and myalgias.  Skin:  Negative for rash.  Neurological:  Negative for dizziness, weakness and headaches.  Endo/Heme/Allergies:  Does not bruise/bleed easily.  Psychiatric/Behavioral:  Negative for depression. The patient is not nervous/anxious and does not have insomnia.     Vital Signs: Blood pressure (!) 146/79, pulse 89, temperature 98.4 F (36.9 C),  temperature source Oral, resp. rate 20, height 5\' 5"  (1.651 m), weight 73.9 kg, SpO2 95 %.  Weight trends: Filed Weights   11/19/21 1151  Weight: 73.9 kg    Physical Exam: General: NAD, resting comfortably  Head: Normocephalic, atraumatic. Moist oral mucosal membranes  Eyes: Anicteric  Lungs:  Clear to auscultation, normal effort, room air BLE  Heart: Regular rate and rhythm  Abdomen:  Soft, nontender  Extremities:  No peripheral edema.  Neurologic: Nonfocal, moving all four extremities  Skin: No lesions  Access: None     Lab results: Basic Metabolic Panel: Recent Labs  Lab 11/20/21 1235 11/21/21 0702 11/22/21 0419  NA 128* 127* 123*  K 4.1 4.2 4.3  CL 94* 95* 93*  CO2 21* 22 20*  GLUCOSE 106* 104* 105*  BUN 33* 28* 20  CREATININE 1.91* 1.84* 1.57*  CALCIUM 8.8* 8.3* 8.0*    Liver Function Tests: Recent Labs  Lab 11/20/21 1235 11/21/21 1154 11/22/21 0419  AST 85* 91* 76*  ALT 61* 65* 56*  ALKPHOS 108 105 86  BILITOT 0.9 0.8 0.9  PROT 6.7 6.7 5.6*  ALBUMIN 3.2* 3.2* 2.8*   No results for input(s): "LIPASE", "AMYLASE" in the last 168 hours. No results for input(s): "AMMONIA" in the last 168 hours.  CBC: Recent Labs  Lab 11/19/21 1153 11/20/21 1235 11/21/21 0702 11/22/21 0419  WBC 6.0 6.0 4.6 4.4  NEUTROABS 5.1  --   --   --   HGB 14.3 13.8 12.2* 11.0*  HCT 41.6 39.1 34.6* 31.0*  MCV 84.0 83.2 83.4 83.1  PLT 113* 126* 147* 170    Cardiac Enzymes: No results for input(s): "CKTOTAL", "CKMB", "CKMBINDEX", "TROPONINI" in the last 168 hours.  BNP: Invalid input(s): "POCBNP"  CBG: No results for input(s): "GLUCAP" in the last 168 hours.  Microbiology: Results for orders placed or performed during the hospital encounter of 11/19/21  Blood culture (routine x 2)     Status: None (Preliminary result)   Collection Time: 11/19/21 12:33 PM   Specimen: BLOOD  Result Value Ref Range Status   Specimen Description BLOOD LEFT ANTECUBITAL  Final    Special Requests   Final    BOTTLES DRAWN AEROBIC AND ANAEROBIC Blood Culture adequate volume   Culture   Final    NO GROWTH 3 DAYS Performed at [facility], [address] [place], [place] [address]    Report Status PENDING  Incomplete  SARS Coronavirus 2 by RT PCR (hospital order, performed in [facility] hospital lab) *cepheid single result test* Urine, Clean Catch     Status: None   Collection Time: 11/19/21 12:33 PM   Specimen: Urine, Clean Catch; Nasal Swab  Result Value Ref Range Status   SARS Coronavirus 2 by RT PCR NEGATIVE NEGATIVE Final    Comment: (NOTE) SARS-CoV-2 target nucleic acids are NOT DETECTED.  The SARS-CoV-2 RNA is generally detectable in upper and lower respiratory specimens during the acute phase of infection. The lowest concentration of SARS-CoV-2 viral copies this assay can detect is 250 copies / mL. A negative result does not preclude SARS-CoV-2 infection and should not be used as the sole basis for treatment or other patient management decisions.  A negative result may occur with improper specimen collection / handling, submission of specimen other than nasopharyngeal swab, presence of viral mutation(s) within the areas targeted by this assay, and inadequate number of viral copies (<250 copies / mL). A negative result must be combined with clinical observations, patient history, and epidemiological information.  Fact Sheet for Patients:   RoadLapTop.co.za  Fact Sheet for Healthcare Providers: http://kim-miller.com/  This test is not yet approved or  cleared by the [place] FDA and has been authorized for detection and/or diagnosis of SARS-CoV-2 by FDA under an Emergency Use Authorization (EUA).  This EUA will remain in effect (meaning this test can be used) for the duration of the COVID-19 declaration under Section 564(b)(1) of  the Act, 21 U.S.C. section 360bbb-3(b)(1), unless the  authorization is terminated or revoked sooner.  Performed at [facility], [address], [place], [place] [address]   Urine Culture     Status: None   Collection Time: 11/19/21 12:33 PM   Specimen: Urine, Clean Catch  Result Value Ref Range Status   Specimen Description   Final    URINE, CLEAN CATCH Performed at [facility], [address], [place], [place] [address]    Special Requests   Final    NONE Performed at [facility], [address] [place], [place] [address]    Culture   Final    NO GROWTH Performed at [facility], [place] [address], [place], [place] [address]    Report Status 11/20/2021 FINAL  Final  Blood culture (routine x 2)     Status: None (Preliminary result)   Collection Time: 11/19/21  3:45 PM   Specimen: BLOOD RIGHT HAND  Result Value Ref Range Status   Specimen Description BLOOD RIGHT HAND  Final   Special Requests   Final    BOTTLES DRAWN AEROBIC AND ANAEROBIC Blood Culture adequate volume   Culture   Final    NO GROWTH 3 DAYS Performed at [facility], [address], [place], [place] [address]    Report Status PENDING  Incomplete  Respiratory (~20 pathogens) panel by PCR     Status: None   Collection Time: 11/20/21 10:30 AM   Specimen: Nasopharyngeal Swab; Respiratory  Result Value Ref Range Status   Adenovirus NOT DETECTED NOT DETECTED Final   Coronavirus 229E NOT DETECTED NOT DETECTED Final    Comment: (NOTE) The Coronavirus on the Respiratory Panel, DOES NOT test for the novel  Coronavirus (2019 nCoV)    Coronavirus HKU1 NOT DETECTED NOT DETECTED Final   Coronavirus NL63 NOT DETECTED NOT DETECTED Final   Coronavirus OC43 NOT DETECTED NOT DETECTED Final   Metapneumovirus NOT DETECTED NOT DETECTED Final   Rhinovirus / Enterovirus NOT DETECTED NOT DETECTED Final   Influenza A NOT DETECTED NOT DETECTED Final   Influenza B NOT DETECTED NOT DETECTED Final   Parainfluenza Virus 1 NOT DETECTED NOT  DETECTED Final   Parainfluenza Virus 2 NOT DETECTED NOT DETECTED Final   Parainfluenza Virus 3 NOT DETECTED NOT DETECTED Final   Parainfluenza Virus 4 NOT DETECTED NOT DETECTED Final   Respiratory Syncytial Virus NOT DETECTED NOT DETECTED Final   Bordetella pertussis NOT DETECTED NOT DETECTED Final   Bordetella Parapertussis NOT DETECTED NOT DETECTED Final   Chlamydophila pneumoniae NOT DETECTED NOT DETECTED Final   Mycoplasma pneumoniae NOT DETECTED NOT DETECTED Final    Comment: Performed at [facility], [place]. [address] [place], [place] [address]    Coagulation Studies: No results for input(s): "LABPROT", "INR" in the last 72 hours.  Urinalysis: No results for input(s): "COLORURINE", "LABSPEC", "PHURINE", "GLUCOSEU", "HGBUR", "BILIRUBINUR", "KETONESUR", "PROTEINUR", "UROBILINOGEN", "NITRITE", "LEUKOCYTESUR" in the last 72 hours.  Invalid input(s): "APPERANCEUR"    Imaging: CT CHEST ABDOMEN PELVIS W CONTRAST  Result Date: 11/22/2021 CLINICAL DATA:  Fevers and night sweats for the past 5 days. EXAM: CT CHEST, ABDOMEN, AND PELVIS WITH CONTRAST TECHNIQUE: Multidetector CT imaging of the chest, abdomen and pelvis was performed following the standard protocol during bolus administration of intravenous contrast. RADIATION DOSE REDUCTION: This exam was performed according to the departmental dose-optimization program which includes automated exposure control, adjustment of the mA and/or kV according to patient size and/or  use of iterative reconstruction technique. CONTRAST:  OMNIPAQUE IOHEXOL 300 MG/ML  SOLN COMPARISON:  CT abdomen pelvis and chest x-ray dated November 19, 2021. FINDINGS: CT CHEST FINDINGS Cardiovascular: No significant vascular findings. Normal heart size. No pericardial effusion. Mild atherosclerotic calcification of the aortic arch and descending thoracic aorta. Mediastinum/Nodes: No enlarged mediastinal, hilar, or axillary lymph nodes. Thyroid gland, trachea, and  esophagus demonstrate no significant findings. Lungs/Pleura: Mild subsegmental atelectasis in the right greater than left lower lobes. No focal consolidation, pleural effusion, or pneumothorax. Musculoskeletal: No acute or significant osseous findings. CT ABDOMEN PELVIS FINDINGS Hepatobiliary: No focal liver abnormality is seen. No gallstones, gallbladder wall thickening, or biliary dilatation. Pancreas: Unremarkable. No pancreatic ductal dilatation or surrounding inflammatory changes. Spleen: Normal in size without focal abnormality. Adrenals/Urinary Tract: Adrenal glands are unremarkable. New trace bilateral perinephric fluid. Symmetric bilateral renal enhancement. Unchanged simple cysts in the left kidney measuring up to 1.8 cm. No follow-up imaging is recommended. Unchanged punctate bilateral renal calculi. No hydronephrosis. Bladder is unremarkable. Stomach/Bowel: Unchanged small hiatal hernia. The stomach is otherwise within normal limits. No bowel wall thickening, distention, or surrounding inflammatory changes. Normal appendix. Vascular/Lymphatic: Aortic atherosclerosis. No enlarged abdominal or pelvic lymph nodes. Reproductive: Prostate is unremarkable. Other: Unchanged tiny fat containing umbilical hernia. No free fluid or pneumoperitoneum. Musculoskeletal: No acute or significant osseous findings. IMPRESSION: 1. No acute abnormality in the chest, abdomen, or pelvis. 2. New trace bilateral perinephric fluid, nonspecific. Correlate with urinalysis. 3. Unchanged bilateral punctate nonobstructive nephrolithiasis. 4. Aortic Atherosclerosis (ICD10-I70.0). Electronically Signed   By: Obie Dredge M.D.   On: 11/22/2021 10:04     Assessment & Plan: Mr. YUSSEF JORGE is a 66 y.o.  male with with past medical history including hyperlipidemia, hypertension, GERD, and prediabetes, who was admitted to [facility] on 11/19/2021 for Dehydration [E86.0] Hyponatremia [E87.1] Sepsis (HCC) [A41.9]  Hyponatremia likely  secondary to SIADH. This is due to the initial correction with normal saline IVF. Sodium corrected to 127. Now back to 123. CT chest negative for ung mass or pneumonia. Recommend stopping normal saline and ordering sodium chloride tabs 1g three times a day. We are hesitant to restart Furosemide. Will also recommend fluid restriction 1.5L. Also hold 3% hypertonic saline for now. Will continue to monitor.   2. Acute kidney injury with chronic kidney disease stage IIIa. Baseline creatinine 1.5 with GFR 57 on 08/20/21. Creatinine on admission 2.39. Slowly improving with IVF. Recommend strict intake and output. Continue to avoid nephrotoxic agents and therapies.      LOS: 2 Jasten Guyette 8/7/20232:27 PM

## 2021-11-23 ENCOUNTER — Inpatient Hospital Stay
Admit: 2021-11-23 | Discharge: 2021-11-23 | Disposition: A | Discharge status: Home / Self Care | Payer: Medicare HMO | Attending: Internal Medicine | Admitting: Internal Medicine

## 2021-11-23 DIAGNOSIS — I10 Essential (primary) hypertension: Secondary | ICD-10-CM | POA: Diagnosis not present

## 2021-11-23 DIAGNOSIS — A419 Sepsis, unspecified organism: Secondary | ICD-10-CM | POA: Diagnosis not present

## 2021-11-23 DIAGNOSIS — G030 Nonpyogenic meningitis: Principal | ICD-10-CM

## 2021-11-23 DIAGNOSIS — R7303 Prediabetes: Secondary | ICD-10-CM | POA: Diagnosis not present

## 2021-11-23 DIAGNOSIS — B349 Viral infection, unspecified: Secondary | ICD-10-CM | POA: Diagnosis not present

## 2021-11-23 DIAGNOSIS — N179 Acute kidney failure, unspecified: Secondary | ICD-10-CM | POA: Diagnosis not present

## 2021-11-23 DIAGNOSIS — R509 Fever, unspecified: Secondary | ICD-10-CM | POA: Diagnosis not present

## 2021-11-23 DIAGNOSIS — R519 Headache, unspecified: Secondary | ICD-10-CM

## 2021-11-23 LAB — COMPREHENSIVE METABOLIC PANEL
ALT: 60 U/L — ABNORMAL HIGH (ref 0–44)
AST: 80 U/L — ABNORMAL HIGH (ref 15–41)
Albumin: 2.9 g/dL — ABNORMAL LOW (ref 3.5–5.0)
Alkaline Phosphatase: 78 U/L (ref 38–126)
Anion gap: 7 (ref 5–15)
BUN: 16 mg/dL (ref 8–23)
CO2: 22 mmol/L (ref 22–32)
Calcium: 8.3 mg/dL — ABNORMAL LOW (ref 8.9–10.3)
Chloride: 97 mmol/L — ABNORMAL LOW (ref 98–111)
Creatinine, Ser: 1.29 mg/dL — ABNORMAL HIGH (ref 0.61–1.24)
GFR, Estimated: >60 mL/min (ref >60)
Glucose, Bld: 117 mg/dL — ABNORMAL HIGH (ref 70–99)
Potassium: 4 mmol/L (ref 3.5–5.1)
Sodium: 126 mmol/L — ABNORMAL LOW (ref 135–145)
Total Bilirubin: 0.8 mg/dL (ref 0.3–1.2)
Total Protein: 5.8 g/dL — ABNORMAL LOW (ref 6.5–8.1)

## 2021-11-23 LAB — CBC
HCT: 31.6 % — ABNORMAL LOW (ref 39.0–52.0)
Hemoglobin: 11.2 g/dL — ABNORMAL LOW (ref 13.0–17.0)
MCH: 29.4 pg (ref 26.0–34.0)
MCHC: 35.4 g/dL (ref 30.0–36.0)
MCV: 82.9 fL (ref 80.0–100.0)
Platelets: 211 10*3/uL (ref 150–400)
RBC: 3.81 MIL/uL — ABNORMAL LOW (ref 4.22–5.81)
RDW: 13.2 % (ref 11.5–15.5)
WBC: 5.2 10*3/uL (ref 4.0–10.5)
nRBC: 0 % (ref 0.0–0.2)

## 2021-11-23 LAB — MENINGITIS/ENCEPHALITIS PANEL (CSF)

## 2021-11-23 LAB — CMV DNA, QUANTITATIVE, PCR
CMV DNA Quant: NEGATIVE IU/mL
Log10 CMV Qn DNA Pl: UNDETERMINED log10 IU/mL

## 2021-11-23 LAB — ECHOCARDIOGRAM COMPLETE
AR max vel: 2.58 cm2
AV Area VTI: 2.46 cm2
AV Area mean vel: 2.71 cm2
AV Mean grad: 4 mmHg
AV Peak grad: 7.7 mmHg
Ao pk vel: 1.39 m/s
Area-P 1/2: 4.54 cm2
Height: 65 in
S' Lateral: 2.9 cm
Weight: 2608 oz

## 2021-11-23 LAB — CSF CELL COUNT WITH DIFFERENTIAL
Eosinophils, CSF: 0 % (ref 0–1)
Lymphs, CSF: 17 % — ABNORMAL LOW (ref 40–80)
Monocyte-Macrophage-Spinal Fluid: 6 % — ABNORMAL LOW (ref 15–45)
RBC Count, CSF: 42 /mm3 — ABNORMAL HIGH
Segmented Neutrophils-CSF: 77 % — ABNORMAL HIGH (ref 0–6)
Tube #: 3
WBC, CSF: 9 /mm3 — ABNORMAL HIGH (ref 0–5)

## 2021-11-23 LAB — PROTEIN AND GLUCOSE, CSF
Glucose, CSF: 59 mg/dL (ref 40–70)
Total  Protein, CSF: 80 mg/dL — ABNORMAL HIGH (ref 15–45)

## 2021-11-23 NOTE — Assessment & Plan Note (Signed)
consider restarting blood pressure medicine if continues to trend up.  Monitor for now 

## 2021-11-23 NOTE — Assessment & Plan Note (Signed)
avoid nephrotoxic agents.  Improving with IV fluid hydration.  Nephrology following   Lab Results  Component Value Date   CREATININE 1.29 (H) 11/23/2021   CREATININE 1.57 (H) 11/22/2021   CREATININE 1.84 (H) 11/21/2021

## 2021-11-23 NOTE — Assessment & Plan Note (Signed)
Continue statin. 

## 2021-11-23 NOTE — Assessment & Plan Note (Signed)
Sodium improving with salt tablets.  Nephrology following     Latest Ref Rng & Units 11/23/2021    5:23 AM 11/22/2021    4:19 AM 11/21/2021    7:02 AM  BMP  Glucose 70 - 99 mg/dL 488  891  694   BUN 8 - 23 mg/dL 16  20  28    Creatinine 0.61 - 1.24 mg/dL  5.03  8.88   Sodium 135 - 145 mmol/L 126  123  127   Potassium 3.5 - 5.1 mmol/L 4.0  4.3  4.2   Chloride 98 - 111 mmol/L 97  93  95   CO2 22 - 32 mmol/L 22  20  22    Calcium 8.9 - 10.3 mg/dL 8.3  8.0  8.3

## 2021-11-23 NOTE — Assessment & Plan Note (Signed)
Continue PPI ?

## 2021-11-23 NOTE — Progress Notes (Signed)
Progress Note   Patient: Ricky Gross HDQ:[id number] DOB: 27-Dec-1955 DOA: 11/19/2021     3 DOS: the patient was seen and examined on 11/23/2021   Brief hospital course: 66 y.o. male with medical history significant of HTN, GERD, HLD, prediabetes who presents with a 4-day history of fevers to 102 and night sweats at home  8/5-8/6 all infectious work-up has been negative thus far.  More ID work-up in progress 8/7: Neurology and nephro consult 8/8: Status post lumbar puncture.  Sodium improving   Assessment and Plan: * Sepsis (HCC) With elevated temp, tachycardia, acute kidney injury, and elevated lactic acid.  Given elevated procalcitonin and fever and trace blood in his urine empirically with Rocephin as it is a kidney infection. Negative urine culture, blood cultures, respiratory virus panel negative.  No overt signs of bacteremia or meningitis at this point. Continue IV fluid resuscitation Pending RMSF, Ehrlichia, Lyme serology.  CMV and hepatitis negative Continue IV doxycycline and Rocephin empirically for now. Echo within normal limit.  Neurology seen for evaluation of meningitis. CT head negative.  Status post LP.  Meningitis/encephalitis panel ordered CT chest abdomen pelvis showed no pathology No fever in last 24 hours  Thrombocytopenia (HCC) Likely due to sepsis.  Platelets normalized.     Latest Ref Rng & Units 11/23/2021    5:23 AM 11/22/2021    4:19 AM 11/21/2021    7:02 AM  CBC  WBC 4.0 - 10.5 K/uL 5.2  4.4  4.6   Hemoglobin 13.0 - 17.0 g/dL 11.9  41.7  40.8   Hematocrit 39.0 - 52.0 % 31.6  31.0  34.6   Platelets 150 - 400 K/uL 211  170  147     AKI (acute kidney injury) (HCC) avoid nephrotoxic agents.  Improving with IV fluid hydration.  Nephrology following   Lab Results  Component Value Date   CREATININE 1.29 (H) 11/23/2021   CREATININE 1.57 (H) 11/22/2021   CREATININE 1.84 (H) 11/21/2021    Hyponatremia Sodium improving with salt tablets.  Nephrology  following     Latest Ref Rng & Units 11/23/2021    5:23 AM 11/22/2021    4:19 AM 11/21/2021    7:02 AM  BMP  Glucose 70 - 99 mg/dL 144  818  563   BUN 8 - 23 mg/dL 16  20  28    Creatinine 0.61 - 1.24 mg/dL  1.49  7.02   Sodium 135 - 145 mmol/L 126  123  127   Potassium 3.5 - 5.1 mmol/L 4.0  4.3  4.2   Chloride 98 - 111 mmol/L 97  93  95   CO2 22 - 32 mmol/L 22  20  22    Calcium 8.9 - 10.3 mg/dL 8.3  8.0  8.3      Pure hypercholesterolemia Continue statin  Gastroesophageal reflux disease without esophagitis Continue PPI  Essential hypertension consider restarting blood pressure medicine if continues to trend up.  Monitor for now.  Borderline diabetes mellitus Low-carb diet.  Hemoglobin A1c 6.3.  Outpatient follow-up with PCP        Subjective: Patient looking much improved today has been afebrile last 24 hours.  No headache, wife reports 7 other family members who were with them in the cruise with a similar symptoms  Physical Exam: Vitals:   11/23/21 0830 11/23/21 0831 11/23/21 1716 11/23/21 2129  BP: (!) 130/91 (!) 130/91 114/83 (!) 144/92  Pulse: 77 82 77 90  Resp: 16 16 16 20   Temp:  98.1 F (36.7 C) 98.1 F (36.7 C) 98.2 F (36.8 C) (!) 97.5 F (36.4 C)  TempSrc: Oral Oral Oral Oral  SpO2:  99% 99% 98%  Weight:      Height:       66 year old male lying in the bed comfortably without any acute distress Lungs clear to auscultation bilaterally Cardiovascular regular rate and rhythm Abdomen soft, benign Neuro alert and oriented, nonfocal Psych normal mood and affect [id number] exam Data Reviewed:  Sodium 126  Family Communication: Wife updated at bedside  Disposition: Status is: Inpatient Remains inpatient appropriate because: Fever/ID work-up in progress   Planned Discharge Destination: Home    DVT prophylaxis-SCDs Time spent: 35 minutes  Author: Delfino Lovett, MD 11/23/2021 9:48 PM  For on call review [email or web address].

## 2021-11-23 NOTE — Assessment & Plan Note (Signed)
Low-carb diet.  Hemoglobin A1c 6.3.  Outpatient follow-up with PCP

## 2021-11-23 NOTE — Progress Notes (Signed)
*  PRELIMINARY RESULTS* Echocardiogram 2D Echocardiogram has been performed.  Ricky Gross 11/23/2021, 8:14 AM

## 2021-11-23 NOTE — Progress Notes (Signed)
Date of Admission:  11/19/2021   Total days of antibiotics ***        Day ***        Day ***        Day ***   ID: Ricky Gross is a 66 y.o. male with  *** Principal Problem:   Sepsis (HCC) Active Problems:   Borderline diabetes mellitus   Essential hypertension   Gastroesophageal reflux disease without esophagitis   Pure hypercholesterolemia   Hyponatremia   AKI (acute kidney injury) (HCC)   Thrombocytopenia (HCC)    Subjective: ***  Medications:   aspirin EC  81 mg Oral Daily   gabapentin  300 mg Oral Daily   gabapentin  600 mg Oral BID   pantoprazole  40 mg Oral Daily   pravastatin  20 mg Oral q1800   sodium chloride  1 g Oral TID WC    Objective: Vital signs in last 24 hours: Temp:  [97.7 F (36.5 C)-98.3 F (36.8 C)] 98.1 F (36.7 C) (08/08 0831) Pulse Rate:  [77-88] 82 (08/08 0831) Resp:  [16-19] 16 (08/08 0831) BP: (107-130)/(71-91) 130/91 (08/08 0831) SpO2:  [95 %-100 %] 99 % (08/08 0831)  LDA Foley Central lines Other catheters  PHYSICAL EXAM:  General: Alert, cooperative, no distress, appears stated age.  Head: Normocephalic, without obvious abnormality, atraumatic. Eyes: Conjunctivae clear, anicteric sclerae. Pupils are equal ENT Nares normal. No drainage or sinus tenderness. Lips, mucosa, and tongue normal. No Thrush Neck: Supple, symmetrical, no adenopathy, thyroid: non tender no carotid bruit and no JVD. Back: No CVA tenderness. Lungs: Clear to auscultation bilaterally. No Wheezing or Rhonchi. No rales. Heart: Regular rate and rhythm, no murmur, rub or gallop. Abdomen: Soft, non-tender,not distended. Bowel sounds normal. No masses Extremities: atraumatic, no cyanosis. No edema. No clubbing Skin: No rashes or lesions. Or bruising Lymph: Cervical, supraclavicular normal. Neurologic: Grossly non-focal  Lab Results Recent Labs    11/22/21 0419 11/23/21 0523  WBC 4.4 5.2  HGB 11.0* 11.2*  HCT 31.0* 31.6*  NA 123* 126*  K 4.3 4.0   CL 93* 97*  CO2 20* 22  BUN 20 16  CREATININE 1.57* 1.29*   Liver Panel Recent Labs    11/21/21 1154 11/22/21 0419 11/23/21 0523  PROT 6.7 5.6* 5.8*  ALBUMIN 3.2* 2.8* 2.9*  AST 91* 76* 80*  ALT 65* 56* 60*  ALKPHOS 105 86 78  BILITOT 0.8 0.9 0.8  BILIDIR 0.2  --   --   IBILI 0.6  --   --    Sedimentation Rate No results for input(s): "ESRSEDRATE" in the last 72 hours. C-Reactive Protein No results for input(s): "CRP" in the last 72 hours.  Microbiology:  Studies/Results: ECHOCARDIOGRAM COMPLETE  Result Date: 11/23/2021    ECHOCARDIOGRAM REPORT   Patient Name:   Ricky Gross Date of Exam: 11/23/2021 Medical Rec #:  [id number]         Height:       65.0 in Accession #:    [id number]        Weight:       163.0 lb Date of Birth:  July 30, 1955          BSA:          1.813 m Patient Age:    66 years          BP:           114/78 mmHg Patient Gender: M  HR:           86 bpm. Exam Location:  [facility] Procedure: 2D Echo, Cardiac Doppler and Color Doppler Indications:     Fever R50.9  History:         Patient has no prior history of Echocardiogram examinations.                  Risk Factors:Hypertension.  Sonographer:     Cristela Blue Referring Phys:  [id number] VIP[facility] Diagnosing Phys: Alwyn Pea MD  Sonographer Comments: Image quality was good. IMPRESSIONS  1. Left ventricular ejection fraction, by estimation, is 55 to 60%. The left ventricle has normal function. The left ventricle has no regional wall motion abnormalities. Left ventricular diastolic parameters were normal.  2. Right ventricular systolic function is normal. The right ventricular size is normal.  3. The mitral valve is normal in structure. No evidence of mitral valve regurgitation.  4. The aortic valve is normal in structure. Aortic valve regurgitation is not visualized. FINDINGS  Left Ventricle: Left ventricular ejection fraction, by estimation, is 55 to 60%. The left ventricle has normal function. The left  ventricle has no regional wall motion abnormalities. The left ventricular internal cavity size was normal in size. There is  borderline left ventricular hypertrophy. Left ventricular diastolic parameters were normal. Right Ventricle: The right ventricular size is normal. No increase in right ventricular wall thickness. Right ventricular systolic function is normal. Left Atrium: Left atrial size was normal in size. Right Atrium: Right atrial size was normal in size. Pericardium: There is no evidence of pericardial effusion. Mitral Valve: The mitral valve is normal in structure. No evidence of mitral valve regurgitation. Tricuspid Valve: The tricuspid valve is normal in structure. Tricuspid valve regurgitation is trivial. Aortic Valve: The aortic valve is normal in structure. Aortic valve regurgitation is not visualized. Aortic valve mean gradient measures 4.0 mmHg. Aortic valve peak gradient measures 7.7 mmHg. Aortic valve area, by VTI measures 2.46 cm. Pulmonic Valve: The pulmonic valve was normal in structure. Pulmonic valve regurgitation is not visualized. Aorta: The ascending aorta was not well visualized. IAS/Shunts: No atrial level shunt detected by color flow Doppler.  LEFT VENTRICLE PLAX 2D LVIDd:         4.20 cm   Diastology LVIDs:         2.90 cm   LV e' medial:    7.07 cm/s LV PW:         1.30 cm   LV E/e' medial:  13.9 LV IVS:        1.20 cm   LV e' lateral:   10.80 cm/s LVOT diam:     2.00 cm   LV E/e' lateral: 9.1 LV SV:         66 LV SV Index:   36 LVOT Area:     3.14 cm  RIGHT VENTRICLE RV S prime:     12.20 cm/s TAPSE (M-mode): 2.7 cm LEFT ATRIUM             Index        RIGHT ATRIUM           Index LA diam:        3.40 cm 1.87 cm/m   RA Area:     15.00 cm LA Vol (A2C):   28.9 ml 15.94 ml/m  RA Volume:   42.00 ml  23.16 ml/m LA Vol (A4C):   26.2 ml 14.45 ml/m LA Biplane Vol: 29.1 ml 16.05 ml/m  AORTIC VALVE AV Area (Vmax):    2.58 cm AV Area (Vmean):   2.71 cm AV Area (VTI):     2.46 cm AV  Vmax:           139.00 cm/s AV Vmean:          90.100 cm/s AV VTI:            0.268 m AV Peak Grad:      7.7 mmHg AV Mean Grad:      4.0 mmHg LVOT Vmax:         114.00 cm/s LVOT Vmean:        77.600 cm/s LVOT VTI:          0.210 m LVOT/AV VTI ratio: 0.78  AORTA Ao Root diam: 3.06 cm MITRAL VALVE               TRICUSPID VALVE MV Area (PHT): 4.54 cm    TR Peak grad:   16.2 mmHg MV Decel Time: 167 msec    TR Vmax:        201.00 cm/s MV E velocity: 98.50 cm/s MV A velocity: 81.80 cm/s  SHUNTS MV E/A ratio:  1.20        Systemic VTI:  0.21 m                            Systemic Diam: 2.00 cm Dwayne D Callwood MD Electronically signed by Alwyn Pea MD Signature Date/Time: 11/23/2021/1:01:40 PM    Final    CT HEAD WO CONTRAST ( )  Result Date: 11/22/2021 CLINICAL DATA:  Headache, sudden, severe EXAM: CT HEAD WITHOUT CONTRAST TECHNIQUE: Contiguous axial images were obtained from the base of the skull through the vertex without intravenous contrast. RADIATION DOSE REDUCTION: This exam was performed according to the departmental dose-optimization program which includes automated exposure control, adjustment of the mA and/or kV according to patient size and/or use of iterative reconstruction technique. COMPARISON:  Head CT 08/02/2019. FINDINGS: Brain: No evidence of acute intracranial hemorrhage or extra-axial collection.Patent basal cisterns. No concerning mass effect.The ventricles are normal in size.Scattered subcortical and periventricular white matter hypodensities, nonspecific but likely sequela of chronic small vessel ischemic disease. Vascular: No hyperdense vessel. Skull: Prior suboccipital craniectomy with unchanged catheter terminating in the fourth ventricle. Sinuses/Orbits: Trace right mastoid effusion. Right ethmoid air cell and frontal sinus mucosal thickening. Orbits are unremarkable. Other: None. IMPRESSION: No acute intracranial abnormality. Prior suboccipital craniectomy with unchanged catheter  terminating in the fourth ventricle. Mild sequela of chronic small vessel ischemic disease. Electronically Signed   By: Caprice Renshaw M.D.   On: 11/22/2021 16:56   CT CHEST ABDOMEN PELVIS W CONTRAST  Result Date: 11/22/2021 CLINICAL DATA:  Fevers and night sweats for the past 5 days. EXAM: CT CHEST, ABDOMEN, AND PELVIS WITH CONTRAST TECHNIQUE: Multidetector CT imaging of the chest, abdomen and pelvis was performed following the standard protocol during bolus administration of intravenous contrast. RADIATION DOSE REDUCTION: This exam was performed according to the departmental dose-optimization program which includes automated exposure control, adjustment of the mA and/or kV according to patient size and/or use of iterative reconstruction technique. CONTRAST:  OMNIPAQUE IOHEXOL 300 MG/ML  SOLN COMPARISON:  CT abdomen pelvis and chest x-ray dated November 19, 2021. FINDINGS: CT CHEST FINDINGS Cardiovascular: No significant vascular findings. Normal heart size. No pericardial effusion. Mild atherosclerotic calcification of the aortic arch and descending thoracic aorta. Mediastinum/Nodes: No enlarged mediastinal, hilar, or  axillary lymph nodes. Thyroid gland, trachea, and esophagus demonstrate no significant findings. Lungs/Pleura: Mild subsegmental atelectasis in the right greater than left lower lobes. No focal consolidation, pleural effusion, or pneumothorax. Musculoskeletal: No acute or significant osseous findings. CT ABDOMEN PELVIS FINDINGS Hepatobiliary: No focal liver abnormality is seen. No gallstones, gallbladder wall thickening, or biliary dilatation. Pancreas: Unremarkable. No pancreatic ductal dilatation or surrounding inflammatory changes. Spleen: Normal in size without focal abnormality. Adrenals/Urinary Tract: Adrenal glands are unremarkable. New trace bilateral perinephric fluid. Symmetric bilateral renal enhancement. Unchanged simple cysts in the left kidney measuring up to 1.8 cm. No follow-up  imaging is recommended. Unchanged punctate bilateral renal calculi. No hydronephrosis. Bladder is unremarkable. Stomach/Bowel: Unchanged small hiatal hernia. The stomach is otherwise within normal limits. No bowel wall thickening, distention, or surrounding inflammatory changes. Normal appendix. Vascular/Lymphatic: Aortic atherosclerosis. No enlarged abdominal or pelvic lymph nodes. Reproductive: Prostate is unremarkable. Other: Unchanged tiny fat containing umbilical hernia. No free fluid or pneumoperitoneum. Musculoskeletal: No acute or significant osseous findings. IMPRESSION: 1. No acute abnormality in the chest, abdomen, or pelvis. 2. New trace bilateral perinephric fluid, nonspecific. Correlate with urinalysis. 3. Unchanged bilateral punctate nonobstructive nephrolithiasis. 4. Aortic Atherosclerosis (ICD10-I70.0). Electronically Signed   By: Obie Dredge M.D.   On: 11/22/2021 10:04     Assessment/Plan: ***

## 2021-11-23 NOTE — Progress Notes (Signed)
[facility] [place] [facility]  ROUNDING NOTE   Subjective:   Patient seen sitting up in bed Wife at bedside Appetite remains poor, denies nausea and vomiting.  Wife states appetite reduced prior to admission and they were forcing fluids to prevent dehydration. Also states he recently stopped adding salt to his meals.  Remains on room air Mild lower extremity edema  Objective:  Vital signs in last 24 hours:  Temp:  [97.7 F (36.5 C)-98.3 F (36.8 C)] 98.1 F (36.7 C) (08/08 0831) Pulse Rate:  [77-88] 82 (08/08 0831) Resp:  [16-19] 16 (08/08 0831) BP: (107-130)/(71-91) 130/91 (08/08 0831) SpO2:  [95 %-100 %] 99 % (08/08 0831)  Weight change:  Filed Weights   11/19/21 1151  Weight: 73.9 kg    Intake/Output: I/O last 3 completed shifts: In: 3604.4 [P.O.:220; I.V.:2434.6; IV Piggyback:949.9] Out: 300 [Urine:300]   Intake/Output this shift:  No intake/output data recorded.  Physical Exam: General: NAD  Head: Normocephalic, atraumatic. Moist oral mucosal membranes  Eyes: Anicteric  Lungs:  Clear to auscultation, normal effort, room air  Heart: Regular rate and rhythm  Abdomen:  Soft, nontender, nondistended  Extremities:  trace peripheral edema.  Neurologic: Nonfocal, moving all four extremities  Skin: No lesions  Access: None    Basic Metabolic Panel: Recent Labs  Lab 11/19/21 1153 11/20/21 1235 11/21/21 0702 11/22/21 0419 11/23/21 0523  NA 125* 128* 127* 123* 126*  K 3.9 4.1 4.2 4.3 4.0  CL 88* 94* 95* 93* 97*  CO2 24 21* 22 20* 22  GLUCOSE 111* 106* 104* 105* 117*  BUN 42* 33* 28* 20 16  CREATININE 2.39* 1.91* 1.84* 1.57* 1.29*  CALCIUM 8.7* 8.8* 8.3* 8.0* 8.3*    Liver Function Tests: Recent Labs  Lab 11/19/21 1153 11/20/21 1235 11/21/21 1154 11/22/21 0419 11/23/21 0523  AST 88* 85* 91* 76* 80*  ALT 72* 61* 65* 56* 60*  ALKPHOS 122 108 105 86 78  BILITOT 0.9 0.9 0.8 0.9 0.8  PROT 7.9 6.7 6.7 5.6* 5.8*  ALBUMIN 4.2 3.2* 3.2* 2.8* 2.9*   No  results for input(s): "LIPASE", "AMYLASE" in the last 168 hours. No results for input(s): "AMMONIA" in the last 168 hours.  CBC: Recent Labs  Lab 11/19/21 1153 11/20/21 1235 11/21/21 0702 11/22/21 0419 11/23/21 0523  WBC 6.0 6.0 4.6 4.4 5.2  NEUTROABS 5.1  --   --   --   --   HGB 14.3 13.8 12.2* 11.0* 11.2*  HCT 41.6 39.1 34.6* 31.0* 31.6*  MCV 84.0 83.2 83.4 83.1 82.9  PLT 113* 126* 147* 170 211    Cardiac Enzymes: No results for input(s): "CKTOTAL", "CKMB", "CKMBINDEX", "TROPONINI" in the last 168 hours.  BNP: Invalid input(s): "POCBNP"  CBG: No results for input(s): "GLUCAP" in the last 168 hours.  Microbiology: Results for orders placed or performed during the hospital encounter of 11/19/21  Blood culture (routine x 2)     Status: None (Preliminary result)   Collection Time: 11/19/21 12:33 PM   Specimen: BLOOD  Result Value Ref Range Status   Specimen Description BLOOD LEFT ANTECUBITAL  Final   Special Requests   Final    BOTTLES DRAWN AEROBIC AND ANAEROBIC Blood Culture adequate volume   Culture   Final    NO GROWTH 4 DAYS Performed at [facility], [address] [place], [place] [address]    Report Status PENDING  Incomplete  SARS Coronavirus 2 by RT PCR (hospital order, performed in [facility] hospital lab) *cepheid single  result test* Urine, Clean Catch     Status: None   Collection Time: 11/19/21 12:33 PM   Specimen: Urine, Clean Catch; Nasal Swab  Result Value Ref Range Status   SARS Coronavirus 2 by RT PCR NEGATIVE NEGATIVE Final    Comment: (NOTE) SARS-CoV-2 target nucleic acids are NOT DETECTED.  The SARS-CoV-2 RNA is generally detectable in upper and lower respiratory specimens during the acute phase of infection. The lowest concentration of SARS-CoV-2 viral copies this assay can detect is 250 copies / mL. A negative result does not preclude SARS-CoV-2 infection and should not be used as the sole basis for treatment or other patient  management decisions.  A negative result may occur with improper specimen collection / handling, submission of specimen other than nasopharyngeal swab, presence of viral mutation(s) within the areas targeted by this assay, and inadequate number of viral copies (<250 copies / mL). A negative result must be combined with clinical observations, patient history, and epidemiological information.  Fact Sheet for Patients:   RoadLapTop.co.za  Fact Sheet for Healthcare Providers: http://kim-miller.com/  This test is not yet approved or  cleared by the [place] FDA and has been authorized for detection and/or diagnosis of SARS-CoV-2 by FDA under an Emergency Use Authorization (EUA).  This EUA will remain in effect (meaning this test can be used) for the duration of the COVID-19 declaration under Section 564(b)(1) of the Act, 21 U.S.C. section 360bbb-3(b)(1), unless the authorization is terminated or revoked sooner.  Performed at [facility], [address] [place], [place] [address]   Urine Culture     Status: None   Collection Time: 11/19/21 12:33 PM   Specimen: Urine, Clean Catch  Result Value Ref Range Status   Specimen Description   Final    URINE, CLEAN CATCH Performed at [facility], [address], [place], [place] [address]    Special Requests   Final    NONE Performed at [facility], [address], [place], [place] [address]    Culture   Final    NO GROWTH Performed at [facility], [address], [place], [place] [address]    Report Status 11/20/2021 FINAL  Final  Blood culture (routine x 2)     Status: None (Preliminary result)   Collection Time: 11/19/21  3:45 PM   Specimen: BLOOD RIGHT HAND  Result Value Ref Range Status   Specimen Description BLOOD RIGHT HAND  Final   Special Requests   Final    BOTTLES DRAWN AEROBIC AND ANAEROBIC Blood Culture adequate volume   Culture    Final    NO GROWTH 4 DAYS Performed at [facility], [address] [place], [place] [address]    Report Status PENDING  Incomplete  Respiratory (~20 pathogens) panel by PCR     Status: None   Collection Time: 11/20/21 10:30 AM   Specimen: Nasopharyngeal Swab; Respiratory  Result Value Ref Range Status   Adenovirus NOT DETECTED NOT DETECTED Final   Coronavirus 229E NOT DETECTED NOT DETECTED Final    Comment: (NOTE) The Coronavirus on the Respiratory Panel, DOES NOT test for the novel  Coronavirus (2019 nCoV)    Coronavirus HKU1 NOT DETECTED NOT DETECTED Final   Coronavirus NL63 NOT DETECTED NOT DETECTED Final   Coronavirus OC43 NOT DETECTED NOT DETECTED Final   Metapneumovirus NOT DETECTED NOT DETECTED Final   Rhinovirus / Enterovirus NOT DETECTED NOT DETECTED Final   Influenza A NOT DETECTED NOT DETECTED  Final   Influenza B NOT DETECTED NOT DETECTED Final   Parainfluenza Virus 1 NOT DETECTED NOT DETECTED Final   Parainfluenza Virus 2 NOT DETECTED NOT DETECTED Final   Parainfluenza Virus 3 NOT DETECTED NOT DETECTED Final   Parainfluenza Virus 4 NOT DETECTED NOT DETECTED Final   Respiratory Syncytial Virus NOT DETECTED NOT DETECTED Final   Bordetella pertussis NOT DETECTED NOT DETECTED Final   Bordetella Parapertussis NOT DETECTED NOT DETECTED Final   Chlamydophila pneumoniae NOT DETECTED NOT DETECTED Final   Mycoplasma pneumoniae NOT DETECTED NOT DETECTED Final    Comment: Performed at [facility], [address] [place], [place] [address]    Coagulation Studies: No results for input(s): "LABPROT", "INR" in the last 72 hours.  Urinalysis: No results for input(s): "COLORURINE", "LABSPEC", "PHURINE", "GLUCOSEU", "HGBUR", "BILIRUBINUR", "KETONESUR", "PROTEINUR", "UROBILINOGEN", "NITRITE", "LEUKOCYTESUR" in the last 72 hours.  Invalid input(s): "APPERANCEUR"    Imaging: CT HEAD WO CONTRAST ( )  Result Date: 11/22/2021 CLINICAL DATA:  Headache, sudden,  severe EXAM: CT HEAD WITHOUT CONTRAST TECHNIQUE: Contiguous axial images were obtained from the base of the skull through the vertex without intravenous contrast. RADIATION DOSE REDUCTION: This exam was performed according to the departmental dose-optimization program which includes automated exposure control, adjustment of the mA and/or kV according to patient size and/or use of iterative reconstruction technique. COMPARISON:  Head CT 08/02/2019. FINDINGS: Brain: No evidence of acute intracranial hemorrhage or extra-axial collection.Patent basal cisterns. No concerning mass effect.The ventricles are normal in size.Scattered subcortical and periventricular white matter hypodensities, nonspecific but likely sequela of chronic small vessel ischemic disease. Vascular: No hyperdense vessel. Skull: Prior suboccipital craniectomy with unchanged catheter terminating in the fourth ventricle. Sinuses/Orbits: Trace right mastoid effusion. Right ethmoid air cell and frontal sinus mucosal thickening. Orbits are unremarkable. Other: None. IMPRESSION: No acute intracranial abnormality. Prior suboccipital craniectomy with unchanged catheter terminating in the fourth ventricle. Mild sequela of chronic small vessel ischemic disease. Electronically Signed   By: Caprice Renshaw M.D.   On: 11/22/2021 16:56   CT CHEST ABDOMEN PELVIS W CONTRAST  Result Date: 11/22/2021 CLINICAL DATA:  Fevers and night sweats for the past 5 days. EXAM: CT CHEST, ABDOMEN, AND PELVIS WITH CONTRAST TECHNIQUE: Multidetector CT imaging of the chest, abdomen and pelvis was performed following the standard protocol during bolus administration of intravenous contrast. RADIATION DOSE REDUCTION: This exam was performed according to the departmental dose-optimization program which includes automated exposure control, adjustment of the mA and/or kV according to patient size and/or use of iterative reconstruction technique. CONTRAST:  OMNIPAQUE IOHEXOL 300 MG/ML   SOLN COMPARISON:  CT abdomen pelvis and chest x-ray dated November 19, 2021. FINDINGS: CT CHEST FINDINGS Cardiovascular: No significant vascular findings. Normal heart size. No pericardial effusion. Mild atherosclerotic calcification of the aortic arch and descending thoracic aorta. Mediastinum/Nodes: No enlarged mediastinal, hilar, or axillary lymph nodes. Thyroid gland, trachea, and esophagus demonstrate no significant findings. Lungs/Pleura: Mild subsegmental atelectasis in the right greater than left lower lobes. No focal consolidation, pleural effusion, or pneumothorax. Musculoskeletal: No acute or significant osseous findings. CT ABDOMEN PELVIS FINDINGS Hepatobiliary: No focal liver abnormality is seen. No gallstones, gallbladder wall thickening, or biliary dilatation. Pancreas: Unremarkable. No pancreatic ductal dilatation or surrounding inflammatory changes. Spleen: Normal in size without focal abnormality. Adrenals/Urinary Tract: Adrenal glands are unremarkable. New trace bilateral perinephric fluid. Symmetric bilateral renal enhancement. Unchanged simple cysts in the left kidney measuring up to 1.8 cm. No follow-up imaging is recommended. Unchanged punctate bilateral renal  calculi. No hydronephrosis. Bladder is unremarkable. Stomach/Bowel: Unchanged small hiatal hernia. The stomach is otherwise within normal limits. No bowel wall thickening, distention, or surrounding inflammatory changes. Normal appendix. Vascular/Lymphatic: Aortic atherosclerosis. No enlarged abdominal or pelvic lymph nodes. Reproductive: Prostate is unremarkable. Other: Unchanged tiny fat containing umbilical hernia. No free fluid or pneumoperitoneum. Musculoskeletal: No acute or significant osseous findings. IMPRESSION: 1. No acute abnormality in the chest, abdomen, or pelvis. 2. New trace bilateral perinephric fluid, nonspecific. Correlate with urinalysis. 3. Unchanged bilateral punctate nonobstructive nephrolithiasis. 4. Aortic  Atherosclerosis (ICD10-I70.0). Electronically Signed   By: Titus Dubin M.D.   On: 11/22/2021 10:04     Medications:    cefTRIAXone (ROCEPHIN)  IV 2 g (11/23/21 1221)   doxycycline (VIBRAMYCIN) IV 100 mg (11/23/21 0421)    aspirin EC  81 mg Oral Daily   gabapentin  300 mg Oral Daily   gabapentin  600 mg Oral BID   pantoprazole  40 mg Oral Daily   pravastatin  20 mg Oral q1800   sodium chloride  1 g Oral TID WC   acetaminophen, HYDROcodone-acetaminophen, metoprolol tartrate, polyethylene glycol  Assessment/ Plan:  Mr. DEJAY SOOKDEO is a 66 y.o.  male with past medical history including hyperlipidemia, hypertension, GERD, and prediabetes, who was admitted to [facility] on 11/19/2021 for Dehydration [E86.0] Hyponatremia [E87.1] Sepsis ([place]) [A41.9]   Hyponatremia likely secondary to SIADH. This is due to the initial correction with normal saline IVF. Sodium corrected to 127 before decreasing again. CT chest negative for lung mass or pneumonia.   Sodium increased to 126 today with current treatments. Continue current measures along with fluid restriction. Primary team liberalized diet to encourage oral intake.    Acute kidney injury with chronic kidney disease stage IIIa. Baseline creatinine 1.5 with GFR 57 on 08/20/21. Creatinine on admission 2.39. Creatinine slowly improved. Continue to avoid nephrotoxic agents and therapies.    Lab Results  Component Value Date   CREATININE 1.29 (H) 11/23/2021   CREATININE 1.57 (H) 11/22/2021   CREATININE 1.84 (H) 11/21/2021    Intake/Output Summary (Last 24 hours) at 11/23/2021 1232 Last data filed at 11/23/2021 0323 Gross per 24 hour  Intake 793.63 ml  Output --  Net 793.63 ml       LOS: 3 Hilmar Moldovan 8/8/202312:32 PM

## 2021-11-23 NOTE — Procedures (Signed)
Indication: Headache, fever  Risks of the procedure were dicussed with the patient including post-LP headache, bleeding, infection, weakness/numbness of legs(radiculopathy), death.  The patient/patient's proxy agreed and written consent was obtained.   The patient was prepped and draped in the seated position and using sterile technique a 20 gauge quinke spinal needle was inserted in the four five space. Approximately 10 cc of CSF were obtained and sent for analysis.   Ritta Slot, MD [facility] [phone]  If 7pm- 7am, please page neurology on call as listed in AMION.

## 2021-11-23 NOTE — Assessment & Plan Note (Signed)
Likely due to sepsis.  Platelets normalized.     Latest Ref Rng & Units 11/23/2021    5:23 AM 11/22/2021    4:19 AM 11/21/2021    7:02 AM  CBC  WBC 4.0 - 10.5 K/uL 5.2  4.4  4.6   Hemoglobin 13.0 - 17.0 g/dL 42.8  76.8  11.5   Hematocrit 39.0 - 52.0 % 31.6  31.0  34.6   Platelets 150 - 400 K/uL 211  170  147

## 2021-11-23 NOTE — Progress Notes (Signed)
Patient reports his headache is relatively unchanged, it waxes and wanes but is currently present.  Not positional.  He is awake, alert, interactive and appropriate.  I discussed LP again with her today, we will proceed with that.  We will send CSF for cells, protein, glucose, culture.  If there is a significant pleocytosis, then will send additional studies as well.  Ritta Slot, MD [facility] [phone]  If 7pm- 7am, please page neurology on call as listed in AMION.

## 2021-11-23 NOTE — Assessment & Plan Note (Signed)
With elevated temp, tachycardia, acute kidney injury, and elevated lactic acid.  Given elevated procalcitonin and fever and trace blood in his urine empirically with Rocephin as it is a kidney infection. Negative urine culture, blood cultures, respiratory virus panel negative.  No overt signs of bacteremia or meningitis at this point. Continue IV fluid resuscitation Pending RMSF, Ehrlichia, Lyme serology.  CMV and hepatitis negative Continue IV doxycycline and Rocephin empirically for now. Echo within normal limit.  Neurology seen for evaluation of meningitis. CT head negative.  Status post LP.  Meningitis/encephalitis panel ordered CT chest abdomen pelvis showed no pathology No fever in last 24 hours

## 2021-11-24 DIAGNOSIS — R509 Fever, unspecified: Secondary | ICD-10-CM | POA: Diagnosis not present

## 2021-11-24 DIAGNOSIS — G030 Nonpyogenic meningitis: Secondary | ICD-10-CM

## 2021-11-24 DIAGNOSIS — R7303 Prediabetes: Secondary | ICD-10-CM | POA: Diagnosis not present

## 2021-11-24 DIAGNOSIS — A419 Sepsis, unspecified organism: Secondary | ICD-10-CM | POA: Diagnosis not present

## 2021-11-24 DIAGNOSIS — E860 Dehydration: Secondary | ICD-10-CM | POA: Diagnosis not present

## 2021-11-24 DIAGNOSIS — N179 Acute kidney failure, unspecified: Secondary | ICD-10-CM | POA: Diagnosis not present

## 2021-11-24 LAB — CULTURE, BLOOD (ROUTINE X 2)
Culture: NO GROWTH
Culture: NO GROWTH
Special Requests: ADEQUATE
Special Requests: ADEQUATE

## 2021-11-24 LAB — CBC
HCT: 31.8 % — ABNORMAL LOW (ref 39.0–52.0)
Hemoglobin: 11.2 g/dL — ABNORMAL LOW (ref 13.0–17.0)
MCH: 29.3 pg (ref 26.0–34.0)
MCHC: 35.2 g/dL (ref 30.0–36.0)
MCV: 83.2 fL (ref 80.0–100.0)
Platelets: 267 10*3/uL (ref 150–400)
RBC: 3.82 MIL/uL — ABNORMAL LOW (ref 4.22–5.81)
RDW: 13.4 % (ref 11.5–15.5)
WBC: 5.9 10*3/uL (ref 4.0–10.5)
nRBC: 0 % (ref 0.0–0.2)

## 2021-11-24 LAB — EHRLICHIA ANTIBODY PANEL
E chaffeensis (HGE) Ab, IgG: NEGATIVE
E chaffeensis (HGE) Ab, IgM: NEGATIVE
E. Chaffeensis (HME) IgM Titer: NEGATIVE
E.Chaffeensis (HME) IgG: NEGATIVE

## 2021-11-24 LAB — QUANTIFERON-TB GOLD PLUS (RQFGPL)
QuantiFERON Mitogen Value: >10 IU/mL
QuantiFERON Nil Value: 0.39 IU/mL
QuantiFERON TB1 Ag Value: 7.53 IU/mL
QuantiFERON TB2 Ag Value: 5.11 IU/mL

## 2021-11-24 LAB — BASIC METABOLIC PANEL
Anion gap: 3 — ABNORMAL LOW (ref 5–15)
BUN: 14 mg/dL (ref 8–23)
CO2: 23 mmol/L (ref 22–32)
Calcium: 8.6 mg/dL — ABNORMAL LOW (ref 8.9–10.3)
Chloride: 102 mmol/L (ref 98–111)
Creatinine, Ser: 1.07 mg/dL (ref 0.61–1.24)
GFR, Estimated: >60 mL/min (ref >60)
Glucose, Bld: 107 mg/dL — ABNORMAL HIGH (ref 70–99)
Potassium: 4.3 mmol/L (ref 3.5–5.1)
Sodium: 128 mmol/L — ABNORMAL LOW (ref 135–145)

## 2021-11-24 LAB — SODIUM: Sodium: 132 mmol/L — ABNORMAL LOW (ref 135–145)

## 2021-11-24 LAB — QUANTIFERON-TB GOLD PLUS: QuantiFERON-TB Gold Plus: POSITIVE — AB

## 2021-11-24 MED ORDER — SODIUM CHLORIDE 1 G PO TABS: 1.0000 g | ORAL_TABLET | Freq: Three times a day (TID) | ORAL | 0 refills | Status: AC

## 2021-11-24 MED ORDER — DOXYCYCLINE HYCLATE 100 MG PO TABS
100.0000 mg | ORAL_TABLET | Freq: Two times a day (BID) | ORAL | 0 refills | Status: AC
Start: 2021-11-24 — End: 2021-11-28

## 2021-11-24 MED ORDER — DOXYCYCLINE HYCLATE 100 MG PO TABS: 100.0000 mg | ORAL_TABLET | Freq: Two times a day (BID) | ORAL | Status: DC

## 2021-11-24 NOTE — Progress Notes (Signed)
Date of Admission:  11/19/2021    ID: CAVAN BEARDEN is a 66 y.o. male  Principal Problem:   Sepsis (HCC) Active Problems:   Borderline diabetes mellitus   Essential hypertension   Gastroesophageal reflux disease without esophagitis   Pure hypercholesterolemia   Hyponatremia   AKI (acute kidney injury) (HCC)   Thrombocytopenia (HCC)    Subjective: No fever in > 60 hrs Feeling better Headache better ambulated   Medications:   aspirin EC  81 mg Oral Daily   gabapentin  300 mg Oral Daily   gabapentin  600 mg Oral BID   pantoprazole  40 mg Oral Daily   pravastatin  20 mg Oral q1800   sodium chloride  1 g Oral TID WC    Objective: Vital signs in last 24 hours: Temp:  [97.4 F (36.3 C)-98.2 F (36.8 C)] 97.4 F (36.3 C) (08/09 0819) Pulse Rate:  [74-90] 74 (08/09 0819) Resp:  [16-20] 18 (08/09 0819) BP: (111-144)/(77-92) 111/77 (08/09 0819) SpO2:  [97 %-100 %] 100 % (08/09 0819)   PHYSICAL EXAM:  General: Alert, cooperative, no distress, appears stated age.  Lungs: Clear to auscultation bilaterally. No Wheezing or Rhonchi. No rales. Heart: Regular rate and rhythm, no murmur, rub or gallop. Abdomen: Soft, non-tender,not distended. Bowel sounds normal. No masses Extremities: atraumatic, no cyanosis. No edema. No clubbing Skin: No rashes or lesions. Or bruising Lymph: Cervical, supraclavicular normal. Neurologic: Grossly non-focal  Lab Results Recent Labs    11/23/21 0523 11/24/21 0548  WBC 5.2 5.9  HGB 11.2* 11.2*  HCT 31.6* 31.8*  NA 126* 128*  K 4.0 4.3  CL 97* 102  CO2 22 23  BUN 16 14  CREATININE 1.29* 1.07   Liver Panel Recent Labs    11/21/21 1154 11/22/21 0419 11/23/21 0523  PROT 6.7 5.6* 5.8*  ALBUMIN 3.2* 2.8* 2.9*  AST 91* 76* 80*  ALT 65* 56* 60*  ALKPHOS 105 86 78  BILITOT 0.8 0.9 0.8  BILIDIR 0.2  --   --   IBILI 0.6  --   --     Microbiology: BC-NG CSF culture neg Studies/Results: ECHOCARDIOGRAM COMPLETE  Result  Date: 11/23/2021    ECHOCARDIOGRAM REPORT   Patient Name:   KENETH BORG Date of Exam: 11/23/2021 Medical Rec #:  [id number]         Height:       65.0 in Accession #:    [id number]        Weight:       163.0 lb Date of Birth:  1955-12-22          BSA:          1.813 m Patient Age:    66 years          BP:           114/78 mmHg Patient Gender: M                 HR:           86 bpm. Exam Location:  [facility] Procedure: 2D Echo, Cardiac Doppler and Color Doppler Indications:     Fever R50.9  History:         Patient has no prior history of Echocardiogram examinations.                  Risk Factors:Hypertension.  Sonographer:     Cristela Blue Referring Phys:  [id number] [facility] Diagnosing Phys: Alwyn Pea MD  Sonographer Comments: Image quality was good. IMPRESSIONS  1. Left ventricular ejection fraction, by estimation, is 55 to 60%. The left ventricle has normal function. The left ventricle has no regional wall motion abnormalities. Left ventricular diastolic parameters were normal.  2. Right ventricular systolic function is normal. The right ventricular size is normal.  3. The mitral valve is normal in structure. No evidence of mitral valve regurgitation.  4. The aortic valve is normal in structure. Aortic valve regurgitation is not visualized. FINDINGS  Left Ventricle: Left ventricular ejection fraction, by estimation, is 55 to 60%. The left ventricle has normal function. The left ventricle has no regional wall motion abnormalities. The left ventricular internal cavity size was normal in size. There is  borderline left ventricular hypertrophy. Left ventricular diastolic parameters were normal. Right Ventricle: The right ventricular size is normal. No increase in right ventricular wall thickness. Right ventricular systolic function is normal. Left Atrium: Left atrial size was normal in size. Right Atrium: Right atrial size was normal in size. Pericardium: There is no evidence of pericardial effusion. Mitral Valve:  The mitral valve is normal in structure. No evidence of mitral valve regurgitation. Tricuspid Valve: The tricuspid valve is normal in structure. Tricuspid valve regurgitation is trivial. Aortic Valve: The aortic valve is normal in structure. Aortic valve regurgitation is not visualized. Aortic valve mean gradient measures 4.0 mmHg. Aortic valve peak gradient measures 7.7 mmHg. Aortic valve area, by VTI measures 2.46 cm. Pulmonic Valve: The pulmonic valve was normal in structure. Pulmonic valve regurgitation is not visualized. Aorta: The ascending aorta was not well visualized. IAS/Shunts: No atrial level shunt detected by color flow Doppler.  LEFT VENTRICLE PLAX 2D LVIDd:         4.20 cm   Diastology LVIDs:         2.90 cm   LV e' medial:    7.07 cm/s LV PW:         1.30 cm   LV E/e' medial:  13.9 LV IVS:        1.20 cm   LV e' lateral:   10.80 cm/s LVOT diam:     2.00 cm   LV E/e' lateral: 9.1 LV SV:         66 LV SV Index:   36 LVOT Area:     3.14 cm  RIGHT VENTRICLE RV S prime:     12.20 cm/s TAPSE (M-mode): 2.7 cm LEFT ATRIUM             Index        RIGHT ATRIUM           Index LA diam:        3.40 cm 1.87 cm/m   RA Area:     15.00 cm LA Vol (A2C):   28.9 ml 15.94 ml/m  RA Volume:   42.00 ml  23.16 ml/m LA Vol (A4C):   26.2 ml 14.45 ml/m LA Biplane Vol: 29.1 ml 16.05 ml/m  AORTIC VALVE AV Area (Vmax):    2.58 cm AV Area (Vmean):   2.71 cm AV Area (VTI):     2.46 cm AV Vmax:           139.00 cm/s AV Vmean:          90.100 cm/s AV VTI:            0.268 m AV Peak Grad:      7.7 mmHg AV Mean Grad:      4.0 mmHg LVOT  Vmax:         114.00 cm/s LVOT Vmean:        77.600 cm/s LVOT VTI:          0.210 m LVOT/AV VTI ratio: 0.78  AORTA Ao Root diam: 3.06 cm MITRAL VALVE               TRICUSPID VALVE MV Area (PHT): 4.54 cm    TR Peak grad:   16.2 mmHg MV Decel Time: 167 msec    TR Vmax:        201.00 cm/s MV E velocity: 98.50 cm/s MV A velocity: 81.80 cm/s  SHUNTS MV E/A ratio:  1.20        Systemic VTI:  0.21  m                            Systemic Diam: 2.00 cm Dwayne D Callwood MD Electronically signed by Alwyn Pea MD Signature Date/Time: 11/23/2021/1:01:40 PM    Final    CT HEAD WO CONTRAST ( )  Result Date: 11/22/2021 CLINICAL DATA:  Headache, sudden, severe EXAM: CT HEAD WITHOUT CONTRAST TECHNIQUE: Contiguous axial images were obtained from the base of the skull through the vertex without intravenous contrast. RADIATION DOSE REDUCTION: This exam was performed according to the departmental dose-optimization program which includes automated exposure control, adjustment of the mA and/or kV according to patient size and/or use of iterative reconstruction technique. COMPARISON:  Head CT 08/02/2019. FINDINGS: Brain: No evidence of acute intracranial hemorrhage or extra-axial collection.Patent basal cisterns. No concerning mass effect.The ventricles are normal in size.Scattered subcortical and periventricular white matter hypodensities, nonspecific but likely sequela of chronic small vessel ischemic disease. Vascular: No hyperdense vessel. Skull: Prior suboccipital craniectomy with unchanged catheter terminating in the fourth ventricle. Sinuses/Orbits: Trace right mastoid effusion. Right ethmoid air cell and frontal sinus mucosal thickening. Orbits are unremarkable. Other: None. IMPRESSION: No acute intracranial abnormality. Prior suboccipital craniectomy with unchanged catheter terminating in the fourth ventricle. Mild sequela of chronic small vessel ischemic disease. Electronically Signed   By: Caprice Renshaw M.D.   On: 11/22/2021 16:56     Assessment/Plan: 66 year old male presenting with fever, headache, chills, sweats, of 4 days duration following a week after cruise   Fever and headache resolved He has normal white count, normal hemoglobin Has low platelet which is improved mildly elevated AST and ALT and hyponatremia. Differential diagnosis includes Viral fever including dengue tickborne illness (I.e   ehrlichia,  Unlikely malaria infection HE could have aseptic meningitis But unlike to be bacterial  IV ceftriaxone DC on 11/23/21 and will continue doxycycline until 11/28/21. 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

## 2021-11-24 NOTE — Plan of Care (Signed)

## 2021-11-24 NOTE — Progress Notes (Addendum)
Patient with mild pleocytosis and elevated protein consistent with aseptic meningitis, likely is part of a larger viral syndrome.  Defer to ID for any antibiotics.  He reports that he is improving from a headache perspective, he is awake, alert, oriented and appropriate.  At this time, treatment from my perspective would be supportive and symptomatic.  Urology will be available on an as-needed basis, please call if further questions or concerns.   Ritta Slot, MD [facility] [phone]  If 7pm- 7am, please page neurology on call as listed in AMION.

## 2021-11-24 NOTE — Discharge Summary (Signed)
Physician Discharge Summary   Patient: Ricky Gross MRN: [id number] DOB: [birth year]  Admit date:     11/19/2021  Discharge date: 11/24/21  Discharge Physician: Lorella Nimrod   PCP: Dion Body, MD   Recommendations at discharge:  Please obtain BMP within next few days and stop salt tablets if appropriate. 2.   Ehrlichia and Lyme serologies are pending  Discharge Diagnoses: Principal Problem:   Sepsis ([place]) Active Problems:   Borderline diabetes mellitus   Essential hypertension   Gastroesophageal reflux disease without esophagitis   Pure hypercholesterolemia   Hyponatremia   AKI (acute kidney injury) ([place])   Thrombocytopenia (HCC)   Dehydration  Resolved Problems:   * No resolved hospital problems. *  Hospital Course: 66 y.o. male with medical history significant of HTN, GERD, HLD, prediabetes who presents with a 4-day history of fevers to 102 and night sweats at home  8/5-8/6 all infectious work-up has been negative thus far.  More ID work-up in progress 8/7: Neurology and nephro consult 8/8: Status post lumbar puncture.  Sodium improving.  8/9: Remained afebrile.  Meningitis/encephalitis panel negative, CSF culture negative in 24 hours.  CSF with mild pleocytosis with WBCs of 9, 77% neutrophil and mildly elevated protein at 80, CMV negative, consistent with aseptic meningitis.   Ehrlichia and Lyme serologies pending. Patient apparently took a cruise trip recently and wife find out that total of 9 people were sick with similar symptoms. No clear source of infection was found so sepsis ruled out.  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  Nephrology is recommending continuation of salt tablets and follow-up with primary care provider who can discontinue extra salt if  appropriate.  Patient was also being given 4 more days of doxycycline.  He will continue with the rest of his home medications and follow-up with his providers.  Assessment and Plan: * Sepsis ([place]) With elevated temp, tachycardia, acute kidney injury, and elevated lactic acid.  Given elevated procalcitonin and fever and trace blood in his urine empirically with Rocephin as it is a kidney infection. Negative urine culture, blood cultures, respiratory virus panel negative.  No overt signs of bacteremia or meningitis at this point. Continue IV fluid resuscitation Pending RMSF, Ehrlichia, Lyme serology.  CMV and hepatitis negative Continue IV doxycycline and Rocephin empirically for now. Echo within normal limit.  Neurology seen for evaluation of meningitis. CT head negative.  Status post LP.  Meningitis/encephalitis panel ordered CT chest abdomen pelvis showed no pathology No fever in last 24 hours  Thrombocytopenia (HCC) Likely due to sepsis.  Platelets normalized.     Latest Ref Rng & Units 11/23/2021    5:23 AM 11/22/2021    4:19 AM 11/21/2021    7:02 AM  CBC  WBC 4.0 - 10.5 K/uL 5.2  4.4  4.6   Hemoglobin 13.0 - 17.0 g/dL 11.2  11.0  12.2   Hematocrit 39.0 - 52.0 % 31.6  31.0  34.6   Platelets 150 - 400 K/uL 211  170  147     AKI (acute kidney injury) ([place]) avoid nephrotoxic agents.  Improving with IV fluid hydration.  Nephrology following   Lab Results  Component Value Date   CREATININE 1.29 (H) 11/23/2021   CREATININE 1.57 (H) 11/22/2021   CREATININE 1.84 (H)  11/21/2021    Hyponatremia Sodium improving with salt tablets.  Nephrology following     Latest Ref Rng & Units 11/23/2021    5:23 AM 11/22/2021    4:19 AM 11/21/2021    7:02 AM  BMP  Glucose 70 - 99 mg/dL 117  105  104   BUN 8 - 23 mg/dL 16  20  28    Creatinine 0.61 - 1.24 mg/dL 1.29  1.57  1.84   Sodium 135 - 145 mmol/L 126  123  127   Potassium 3.5 - 5.1 mmol/L 4.0  4.3  4.2   Chloride 98 - 111 mmol/L 97  93   95   CO2 22 - 32 mmol/L 22  20  22    Calcium 8.9 - 10.3 mg/dL 8.3  8.0  8.3      Pure hypercholesterolemia Continue statin  Gastroesophageal reflux disease without esophagitis Continue PPI  Essential hypertension consider restarting blood pressure medicine if continues to trend up.  Monitor for now.  Borderline diabetes mellitus Low-carb diet.  Hemoglobin A1c 6.3.  Outpatient follow-up with PCP   Consultants: Infectious disease, nephrology Procedures performed: None Disposition: Home Diet recommendation:  Discharge Diet Orders (From admission, onward)     Start     Ordered   11/24/21 0000  Diet - low sodium heart healthy        11/24/21 1714           Regular diet DISCHARGE MEDICATION: Allergies as of 11/24/2021   No Known Allergies      Medication List     STOP taking these medications    chlordiazePOXIDE 5 MG capsule Commonly known as: LIBRIUM       TAKE these medications    aspirin EC 81 MG tablet Take 1 tablet by mouth daily.   doxycycline 100 MG tablet Commonly known as: VIBRA-TABS Take 1 tablet (100 mg total) by mouth every 12 (twelve) hours for 8 doses.   gabapentin 300 MG capsule Commonly known as: NEURONTIN Take 1-2 capsules by mouth 3 (three) times daily.  2 po q am, 1 po q afternoon, 2 po q hs   lisinopril-hydrochlorothiazide 10-12.5 MG tablet Commonly known as: ZESTORETIC Take 1 tablet by mouth daily.   lovastatin 20 MG tablet Commonly known as: MEVACOR Take 1 tablet by mouth at bedtime.   omeprazole 20 MG capsule Commonly known as: PRILOSEC Take 20 mg by mouth daily.   sodium chloride 1 g tablet Take 1 tablet (1 g total) by mouth 3 (three) times daily with meals.        Follow-up Information     Dion Body, MD. Schedule an appointment as soon as possible for a visit in 1 week(s).   Specialty: Family Medicine Contact information: [place]  [phone]                Discharge Exam: Danley Danker Weights   11/19/21 1151  Weight: 73.9 kg   General.     In no acute distress. Pulmonary.  Lungs clear bilaterally, normal respiratory effort. CV.  Regular rate and rhythm, no JVD, rub or murmur. Abdomen.  Soft, nontender, nondistended, BS positive. CNS.  Alert and oriented .  No focal neurologic deficit. Extremities.  No edema, no cyanosis, pulses intact and symmetrical. Psychiatry.  Judgment and insight appears normal.   Condition at discharge: stable  The results of significant diagnostics from this hospitalization (including imaging, microbiology, ancillary and laboratory) are listed below  for reference.   Imaging Studies: ECHOCARDIOGRAM COMPLETE  Result Date: 11/23/2021    ECHOCARDIOGRAM REPORT   Patient Name:   NISSAN FRAZZINI Date of Exam: 11/23/2021 Medical Rec #:  [id number]         Height:       65.0 in Accession #:    [id number]        Weight:       163.0 lb Date of Birth:  04-23-1955          BSA:          1.813 m Patient Age:    66 years          BP:           114/78 mmHg Patient Gender: M                 HR:           86 bpm. Exam Location:  [facility] Procedure: 2D Echo, Cardiac Doppler and Color Doppler Indications:     Fever R50.9  History:         Patient has no prior history of Echocardiogram examinations.                  Risk Factors:Hypertension.  Sonographer:     Cristela Blue Referring Phys:  [id number] VIPUL [facility] Diagnosing Phys: Alwyn Pea MD  Sonographer Comments: Image quality was good. IMPRESSIONS  1. Left ventricular ejection fraction, by estimation, is 55 to 60%. The left ventricle has normal function. The left ventricle has no regional wall motion abnormalities. Left ventricular diastolic parameters were normal.  2. Right ventricular systolic function is normal. The right ventricular size is normal.  3. The mitral valve is normal in structure. No evidence of mitral valve regurgitation.  4. The aortic valve  is normal in structure. Aortic valve regurgitation is not visualized. FINDINGS  Left Ventricle: Left ventricular ejection fraction, by estimation, is 55 to 60%. The left ventricle has normal function. The left ventricle has no regional wall motion abnormalities. The left ventricular internal cavity size was normal in size. There is  borderline left ventricular hypertrophy. Left ventricular diastolic parameters were normal. Right Ventricle: The right ventricular size is normal. No increase in right ventricular wall thickness. Right ventricular systolic function is normal. Left Atrium: Left atrial size was normal in size. Right Atrium: Right atrial size was normal in size. Pericardium: There is no evidence of pericardial effusion. Mitral Valve: The mitral valve is normal in structure. No evidence of mitral valve regurgitation. Tricuspid Valve: The tricuspid valve is normal in structure. Tricuspid valve regurgitation is trivial. Aortic Valve: The aortic valve is normal in structure. Aortic valve regurgitation is not visualized. Aortic valve mean gradient measures 4.0 mmHg. Aortic valve peak gradient measures 7.7 mmHg. Aortic valve area, by VTI measures 2.46 cm. Pulmonic Valve: The pulmonic valve was normal in structure. Pulmonic valve regurgitation is not visualized. Aorta: The ascending aorta was not well visualized. IAS/Shunts: No atrial level shunt detected by color flow Doppler.  LEFT VENTRICLE PLAX 2D LVIDd:         4.20 cm   Diastology LVIDs:         2.90 cm   LV e' medial:    7.07 cm/s LV PW:         1.30 cm   LV E/e' medial:  13.9 LV IVS:        1.20 cm   LV e' lateral:   10.80 cm/s  LVOT diam:     2.00 cm   LV E/e' lateral: 9.1 LV SV:         66 LV SV Index:   36 LVOT Area:     3.14 cm  RIGHT VENTRICLE RV S prime:     12.20 cm/s TAPSE (M-mode): 2.7 cm LEFT ATRIUM             Index        RIGHT ATRIUM           Index LA diam:        3.40 cm 1.87 cm/m   RA Area:     15.00 cm LA Vol (A2C):   28.9 ml 15.94  ml/m  RA Volume:   42.00 ml  23.16 ml/m LA Vol (A4C):   26.2 ml 14.45 ml/m LA Biplane Vol: 29.1 ml 16.05 ml/m  AORTIC VALVE AV Area (Vmax):    2.58 cm AV Area (Vmean):   2.71 cm AV Area (VTI):     2.46 cm AV Vmax:           139.00 cm/s AV Vmean:          90.100 cm/s AV VTI:            0.268 m AV Peak Grad:      7.7 mmHg AV Mean Grad:      4.0 mmHg LVOT Vmax:         114.00 cm/s LVOT Vmean:        77.600 cm/s LVOT VTI:          0.210 m LVOT/AV VTI ratio: 0.78  AORTA Ao Root diam: 3.06 cm MITRAL VALVE               TRICUSPID VALVE MV Area (PHT): 4.54 cm    TR Peak grad:   16.2 mmHg MV Decel Time: 167 msec    TR Vmax:        201.00 cm/s MV E velocity: 98.50 cm/s MV A velocity: 81.80 cm/s  SHUNTS MV E/A ratio:  1.20        Systemic VTI:  0.21 m                            Systemic Diam: 2.00 cm Dwayne D Callwood MD Electronically signed by Yolonda Kida MD Signature Date/Time: 11/23/2021/1:01:40 PM    Final    CT HEAD WO CONTRAST (5MM)  Result Date: 11/22/2021 CLINICAL DATA:  Headache, sudden, severe EXAM: CT HEAD WITHOUT CONTRAST TECHNIQUE: Contiguous axial images were obtained from the base of the skull through the vertex without intravenous contrast. RADIATION DOSE REDUCTION: This exam was performed according to the departmental dose-optimization program which includes automated exposure control, adjustment of the mA and/or kV according to patient size and/or use of iterative reconstruction technique. COMPARISON:  Head CT 08/02/2019. FINDINGS: Brain: No evidence of acute intracranial hemorrhage or extra-axial collection.Patent basal cisterns. No concerning mass effect.The ventricles are normal in size.Scattered subcortical and periventricular white matter hypodensities, nonspecific but likely sequela of chronic small vessel ischemic disease. Vascular: No hyperdense vessel. Skull: Prior suboccipital craniectomy with unchanged catheter terminating in the fourth ventricle. Sinuses/Orbits: Trace right mastoid  effusion. Right ethmoid air cell and frontal sinus mucosal thickening. Orbits are unremarkable. Other: None. IMPRESSION: No acute intracranial abnormality. Prior suboccipital craniectomy with unchanged catheter terminating in the fourth ventricle. Mild sequela of chronic small vessel ischemic disease. Electronically Signed   By: Edison Nasuti  Park Breed M.D.   On: 11/22/2021 16:56   CT CHEST ABDOMEN PELVIS W CONTRAST  Result Date: 11/22/2021 CLINICAL DATA:  Fevers and night sweats for the past 5 days. EXAM: CT CHEST, ABDOMEN, AND PELVIS WITH CONTRAST TECHNIQUE: Multidetector CT imaging of the chest, abdomen and pelvis was performed following the standard protocol during bolus administration of intravenous contrast. RADIATION DOSE REDUCTION: This exam was performed according to the departmental dose-optimization program which includes automated exposure control, adjustment of the mA and/or kV according to patient size and/or use of iterative reconstruction technique. CONTRAST:  OMNIPAQUE IOHEXOL 300 MG/ML  SOLN COMPARISON:  CT abdomen pelvis and chest x-ray dated November 19, 2021. FINDINGS: CT CHEST FINDINGS Cardiovascular: No significant vascular findings. Normal heart size. No pericardial effusion. Mild atherosclerotic calcification of the aortic arch and descending thoracic aorta. Mediastinum/Nodes: No enlarged mediastinal, hilar, or axillary lymph nodes. Thyroid gland, trachea, and esophagus demonstrate no significant findings. Lungs/Pleura: Mild subsegmental atelectasis in the right greater than left lower lobes. No focal consolidation, pleural effusion, or pneumothorax. Musculoskeletal: No acute or significant osseous findings. CT ABDOMEN PELVIS FINDINGS Hepatobiliary: No focal liver abnormality is seen. No gallstones, gallbladder wall thickening, or biliary dilatation. Pancreas: Unremarkable. No pancreatic ductal dilatation or surrounding inflammatory changes. Spleen: Normal in size without focal abnormality.  Adrenals/Urinary Tract: Adrenal glands are unremarkable. New trace bilateral perinephric fluid. Symmetric bilateral renal enhancement. Unchanged simple cysts in the left kidney measuring up to 1.8 cm. No follow-up imaging is recommended. Unchanged punctate bilateral renal calculi. No hydronephrosis. Bladder is unremarkable. Stomach/Bowel: Unchanged small hiatal hernia. The stomach is otherwise within normal limits. No bowel wall thickening, distention, or surrounding inflammatory changes. Normal appendix. Vascular/Lymphatic: Aortic atherosclerosis. No enlarged abdominal or pelvic lymph nodes. Reproductive: Prostate is unremarkable. Other: Unchanged tiny fat containing umbilical hernia. No free fluid or pneumoperitoneum. Musculoskeletal: No acute or significant osseous findings. IMPRESSION: 1. No acute abnormality in the chest, abdomen, or pelvis. 2. New trace bilateral perinephric fluid, nonspecific. Correlate with urinalysis. 3. Unchanged bilateral punctate nonobstructive nephrolithiasis. 4. Aortic Atherosclerosis (ICD10-I70.0). Electronically Signed   By: Obie Dredge M.D.   On: 11/22/2021 10:04   CT ABDOMEN PELVIS WO CONTRAST  Result Date: 11/19/2021 CLINICAL DATA:  Sepsis, dizziness, weakness and fever. EXAM: CT ABDOMEN AND PELVIS WITHOUT CONTRAST TECHNIQUE: Multidetector CT imaging of the abdomen and pelvis was performed following the standard protocol without IV contrast. RADIATION DOSE REDUCTION: This exam was performed according to the departmental dose-optimization program which includes automated exposure control, adjustment of the mA and/or kV according to patient size and/or use of iterative reconstruction technique. COMPARISON:  None Available. FINDINGS: Lower chest: Small hiatal hernia. Hepatobiliary: No focal liver abnormality is seen. No gallstones, gallbladder wall thickening, or biliary dilatation. Pancreas: Unremarkable. No pancreatic ductal dilatation or surrounding inflammatory changes.  Spleen: Normal in size without focal abnormality. Adrenals/Urinary Tract: Adrenal glands are unremarkable. Kidneys are normal, without renal calculi, focal lesion, or hydronephrosis. Bladder is unremarkable. Stomach/Bowel: Bowel shows no evidence of obstruction, ileus, inflammation or lesion. The appendix is not well visualized. No free intraperitoneal air. Vascular/Lymphatic: Atherosclerosis of the abdominal aorta without evidence of aneurysm. No enlarged lymph nodes identified. Reproductive: Prostate is unremarkable. Other: No abdominal wall hernia or abnormality. No abdominopelvic ascites. Musculoskeletal: No acute or significant osseous findings. IMPRESSION: 1. No acute findings in the abdomen or pelvis. 2. Small hiatal hernia. 3. Atherosclerosis of the abdominal aorta without evidence of aneurysm. Electronically Signed   By: Irish Lack M.D.   On: 11/19/2021  15:12   DG Chest 2 View  Result Date: 11/19/2021 CLINICAL DATA:  Shortness of breath. EXAM: CHEST - 2 VIEW COMPARISON:  Chest x-ray February 06, 2008. FINDINGS: The heart size and mediastinal contours are within normal limits. Nodular opacity at the lower lateral right lung probably represents a nipple shadow. Otherwise, both lungs are clear. No visible pleural effusions or pneumothorax. No acute osseous abnormality. IMPRESSION: 1. Nodular opacity at the lower lateral right lung probably represents a nipple shadow, but recommend repeat radiographs with nipple markers to confirm and exclude pulmonary nodule. 2. Otherwise, no active cardiopulmonary disease. Electronically Signed   By: Feliberto Harts M.D.   On: 11/19/2021 12:36    Microbiology: Results for orders placed or performed during the hospital encounter of 11/19/21  Blood culture (routine x 2)     Status: None   Collection Time: 11/19/21 12:33 PM   Specimen: BLOOD  Result Value Ref Range Status   Specimen Description BLOOD LEFT ANTECUBITAL  Final   Special Requests   Final     BOTTLES DRAWN AEROBIC AND ANAEROBIC Blood Culture adequate volume   Culture   Final    NO GROWTH 5 DAYS Performed at [facility], [address] [place], [place] [address]    Report Status 11/24/2021 FINAL  Final  SARS Coronavirus 2 by RT PCR (hospital order, performed in [facility] hospital lab) *cepheid single result test* Urine, Clean Catch     Status: None   Collection Time: 11/19/21 12:33 PM   Specimen: Urine, Clean Catch; Nasal Swab  Result Value Ref Range Status   SARS Coronavirus 2 by RT PCR NEGATIVE NEGATIVE Final    Comment: (NOTE) SARS-CoV-2 target nucleic acids are NOT DETECTED.  The SARS-CoV-2 RNA is generally detectable in upper and lower respiratory specimens during the acute phase of infection. The lowest concentration of SARS-CoV-2 viral copies this assay can detect is 250 copies / mL. A negative result does not preclude SARS-CoV-2 infection and should not be used as the sole basis for treatment or other patient management decisions.  A negative result may occur with improper specimen collection / handling, submission of specimen other than nasopharyngeal swab, presence of viral mutation(s) within the areas targeted by this assay, and inadequate number of viral copies (<250 copies / mL). A negative result must be combined with clinical observations, patient history, and epidemiological information.  Fact Sheet for Patients:   RoadLapTop.co.za  Fact Sheet for Healthcare Providers: http://kim-miller.com/  This test is not yet approved or  cleared by the [place] FDA and has been authorized for detection and/or diagnosis of SARS-CoV-2 by FDA under an Emergency Use Authorization (EUA).  This EUA will remain in effect (meaning this test can be used) for the duration of the COVID-19 declaration under Section 564(b)(1) of the Act, 21 U.S.C. section 360bbb-3(b)(1), unless the authorization is terminated  or revoked sooner.  Performed at [facility], [address] [place], [place] [address]   Urine Culture     Status: None   Collection Time: 11/19/21 12:33 PM   Specimen: Urine, Clean Catch  Result Value Ref Range Status   Specimen Description   Final    URINE, CLEAN CATCH Performed at [facility], [address] [place], [place] [address]    Special Requests   Final    NONE Performed at [facility], [address] [place], [place] [address]    Culture   Final    NO GROWTH Performed  at [facility], [place] [address] [place], [place] [address]    Report Status 11/20/2021 FINAL  Final  Blood culture (routine x 2)     Status: None   Collection Time: 11/19/21  3:45 PM   Specimen: BLOOD RIGHT HAND  Result Value Ref Range Status   Specimen Description BLOOD RIGHT HAND  Final   Special Requests   Final    BOTTLES DRAWN AEROBIC AND ANAEROBIC Blood Culture adequate volume   Culture   Final    NO GROWTH 5 DAYS Performed at [facility], [address] [place], [place] [address]    Report Status 11/24/2021 FINAL  Final  Respiratory (~20 pathogens) panel by PCR     Status: None   Collection Time: 11/20/21 10:30 AM   Specimen: Nasopharyngeal Swab; Respiratory  Result Value Ref Range Status   Adenovirus NOT DETECTED NOT DETECTED Final   Coronavirus 229E NOT DETECTED NOT DETECTED Final    Comment: (NOTE) The Coronavirus on the Respiratory Panel, DOES NOT test for the novel  Coronavirus (2019 nCoV)    Coronavirus HKU1 NOT DETECTED NOT DETECTED Final   Coronavirus NL63 NOT DETECTED NOT DETECTED Final   Coronavirus OC43 NOT DETECTED NOT DETECTED Final   Metapneumovirus NOT DETECTED NOT DETECTED Final   Rhinovirus / Enterovirus NOT DETECTED NOT DETECTED Final   Influenza A NOT DETECTED NOT DETECTED Final   Influenza B NOT DETECTED NOT DETECTED Final   Parainfluenza Virus 1 NOT DETECTED NOT DETECTED Final   Parainfluenza Virus 2 NOT  DETECTED NOT DETECTED Final   Parainfluenza Virus 3 NOT DETECTED NOT DETECTED Final   Parainfluenza Virus 4 NOT DETECTED NOT DETECTED Final   Respiratory Syncytial Virus NOT DETECTED NOT DETECTED Final   Bordetella pertussis NOT DETECTED NOT DETECTED Final   Bordetella Parapertussis NOT DETECTED NOT DETECTED Final   Chlamydophila pneumoniae NOT DETECTED NOT DETECTED Final   Mycoplasma pneumoniae NOT DETECTED NOT DETECTED Final    Comment: Performed at [facility], [place] [address], [place], [place] [address]  CSF culture w Gram Stain     Status: None (Preliminary result)   Collection Time: 11/23/21 12:20 PM   Specimen: CSF; Cerebrospinal Fluid  Result Value Ref Range Status   Specimen Description   Final    CSF Performed at [facility], [address], [place], [place] [address]    Special Requests   Final    NONE Performed at [facility], [address] [place], [place] [address]    Gram Stain   Final    WBC SEEN RBC SEEN NO ORGANISMS SEEN Performed at [facility], [address] [place], [place] [address]    Culture   Final    NO GROWTH < 24 HOURS Performed at [facility], [place] [address], [place], [place] [address]    Report Status PENDING  Incomplete    Labs: CBC: Recent Labs  Lab 11/19/21 1153 11/20/21 1235 11/21/21 0702 11/22/21 0419 11/23/21 0523 11/24/21 0548  WBC 6.0 6.0 4.6 4.4 5.2 5.9  NEUTROABS 5.1  --   --   --   --   --   HGB 14.3 13.8 12.2* 11.0* 11.2* 11.2*  HCT 41.6 39.1 34.6* 31.0* 31.6* 31.8*  MCV 84.0 83.2 83.4 83.1 82.9 83.2  PLT 113* 126* 147* 170 211 [id number]   Basic Metabolic Panel: Recent Labs  Lab 11/20/21 1235 11/21/21 0702 11/22/21 0419 11/23/21 0523 11/24/21 0548 11/24/21 1501  NA 128* 127* 123* 126* 128* 132*  K 4.1 4.2 4.3 4.0 4.3  --   CL 94* 95* 93* 97* 102  --   CO2 21* 22 20* 22 23  --   GLUCOSE 106* 104* 105* 117* 107*  --   BUN 33* 28* 20 16 14   --   CREATININE 1.91* 1.84*  1.57* 1.29* 1.07  --   CALCIUM 8.8* 8.3* 8.0* 8.3* 8.6*  --    Liver Function Tests: Recent Labs  Lab 11/19/21 1153 11/20/21 1235 11/21/21 1154 11/22/21 0419 11/23/21 0523  AST 88* 85* 91* 76* 80*  ALT 72* 61* 65* 56* 60*  ALKPHOS 122 108 105 86 78  BILITOT 0.9 0.9 0.8 0.9 0.8  PROT 7.9 6.7 6.7 5.6* 5.8*  ALBUMIN 4.2 3.2* 3.2* 2.8* 2.9*   CBG: No results for input(s): "GLUCAP" in the last 168 hours.  Discharge time spent: greater than 30 minutes.  This record has been created using Systems analyst. Errors have been sought and corrected,but may not always be located. Such creation errors do not reflect on the standard of care.   Signed: Lorella Nimrod, MD [facility] 11/24/2021

## 2021-11-24 NOTE — Progress Notes (Signed)
[facility]  ROUNDING NOTE   Subjective:   Patient seen sitting up in bed Wife at bedside Decreased headache No lower extremity edema Attempting to consume meals to improve appetite  Sodium 128  Objective:  Vital signs in last 24 hours:  Temp:  [97.4 F (36.3 C)-98.2 F (36.8 C)] 97.4 F (36.3 C) (08/09 0819) Pulse Rate:  [74-90] 74 (08/09 0819) Resp:  [16-20] 18 (08/09 0819) BP: (111-144)/(77-92) 111/77 (08/09 0819) SpO2:  [97 %-100 %] 100 % (08/09 0819)  Weight change:  Filed Weights   11/19/21 1151  Weight: 73.9 kg    Intake/Output: I/O last 3 completed shifts: In: 56 [IV Piggyback:66] Out: 740 [Urine:740]   Intake/Output this shift:  Total I/O In: 120 [P.O.:120] Out: -   Physical Exam: General: NAD  Head: Normocephalic Moist oral mucosal membranes  Eyes: Anicteric  Lungs:  Clear to auscultation, normal effort, room air  Heart: Regular rate and rhythm  Abdomen:  Soft, nontender, nondistended  Extremities: No peripheral edema.  Neurologic: Nonfocal, moving all four extremities  Skin: No lesions  Access: None    Basic Metabolic Panel: Recent Labs  Lab 11/20/21 1235 11/21/21 0702 11/22/21 0419 11/23/21 0523 11/24/21 0548  NA 128* 127* 123* 126* 128*  K 4.1 4.2 4.3 4.0 4.3  CL 94* 95* 93* 97* 102  CO2 21* 22 20* 22 23  GLUCOSE 106* 104* 105* 117* 107*  BUN 33* 28* 20 16 14   CREATININE 1.91* 1.84* 1.57* 1.29* 1.07  CALCIUM 8.8* 8.3* 8.0* 8.3* 8.6*     Liver Function Tests: Recent Labs  Lab 11/19/21 1153 11/20/21 1235 11/21/21 1154 11/22/21 0419 11/23/21 0523  AST 88* 85* 91* 76* 80*  ALT 72* 61* 65* 56* 60*  ALKPHOS 122 108 105 86 78  BILITOT 0.9 0.9 0.8 0.9 0.8  PROT 7.9 6.7 6.7 5.6* 5.8*  ALBUMIN 4.2 3.2* 3.2* 2.8* 2.9*    No results for input(s): "LIPASE", "AMYLASE" in the last 168 hours. No results for input(s): "AMMONIA" in the last 168 hours.  CBC: Recent Labs  Lab 11/19/21 1153 11/20/21 1235  11/21/21 0702 11/22/21 0419 11/23/21 0523 11/24/21 0548  WBC 6.0 6.0 4.6 4.4 5.2 5.9  NEUTROABS 5.1  --   --   --   --   --   HGB 14.3 13.8 12.2* 11.0* 11.2* 11.2*  HCT 41.6 39.1 34.6* 31.0* 31.6* 31.8*  MCV 84.0 83.2 83.4 83.1 82.9 83.2  PLT 113* 126* 147* 170 211 267     Cardiac Enzymes: No results for input(s): "CKTOTAL", "CKMB", "CKMBINDEX", "TROPONINI" in the last 168 hours.  BNP: Invalid input(s): "POCBNP"  CBG: No results for input(s): "GLUCAP" in the last 168 hours.  Microbiology: Results for orders placed or performed during the hospital encounter of 11/19/21  Blood culture (routine x 2)     Status: None   Collection Time: 11/19/21 12:33 PM   Specimen: BLOOD  Result Value Ref Range Status   Specimen Description BLOOD LEFT ANTECUBITAL  Final   Special Requests   Final    BOTTLES DRAWN AEROBIC AND ANAEROBIC Blood Culture adequate volume   Culture   Final    NO GROWTH 5 DAYS Performed at [facility], [address], [place], [place] [address]    Report Status 11/24/2021 FINAL  Final  SARS Coronavirus 2 by RT PCR (hospital order, performed in [facility] hospital lab) *cepheid single result test* Urine, Clean Catch     Status: None   Collection  Time: 11/19/21 12:33 PM   Specimen: Urine, Clean Catch; Nasal Swab  Result Value Ref Range Status   SARS Coronavirus 2 by RT PCR NEGATIVE NEGATIVE Final    Comment: (NOTE) SARS-CoV-2 target nucleic acids are NOT DETECTED.  The SARS-CoV-2 RNA is generally detectable in upper and lower respiratory specimens during the acute phase of infection. The lowest concentration of SARS-CoV-2 viral copies this assay can detect is 250 copies / mL. A negative result does not preclude SARS-CoV-2 infection and should not be used as the sole basis for treatment or other patient management decisions.  A negative result may occur with improper specimen collection / handling, submission of specimen other than nasopharyngeal swab,  presence of viral mutation(s) within the areas targeted by this assay, and inadequate number of viral copies (<250 copies / mL). A negative result must be combined with clinical observations, patient history, and epidemiological information.  Fact Sheet for Patients:   https://www.patel.info/  Fact Sheet for Healthcare Providers: https://hall.com/  This test is not yet approved or  cleared by the [place] FDA and has been authorized for detection and/or diagnosis of SARS-CoV-2 by FDA under an Emergency Use Authorization (EUA).  This EUA will remain in effect (meaning this test can be used) for the duration of the COVID-19 declaration under Section 564(b)(1) of the Act, 21 U.S.C. section 360bbb-3(b)(1), unless the authorization is terminated or revoked sooner.  Performed at [facility], [address] [place], [place] [address]   Urine Culture     Status: None   Collection Time: 11/19/21 12:33 PM   Specimen: Urine, Clean Catch  Result Value Ref Range Status   Specimen Description   Final    URINE, CLEAN CATCH Performed at [facility], [address] [place], [place] [address]    Special Requests   Final    NONE Performed at [facility], [address], [place], [place] [address]    Culture   Final    NO GROWTH Performed at [facility], [place] [address] [place], [place] [address]    Report Status 11/20/2021 FINAL  Final  Blood culture (routine x 2)     Status: None   Collection Time: 11/19/21  3:45 PM   Specimen: BLOOD RIGHT HAND  Result Value Ref Range Status   Specimen Description BLOOD RIGHT HAND  Final   Special Requests   Final    BOTTLES DRAWN AEROBIC AND ANAEROBIC Blood Culture adequate volume   Culture   Final    NO GROWTH 5 DAYS Performed at [facility], [address] [place], [place] [address]    Report Status 11/24/2021 FINAL  Final  Respiratory (~20  pathogens) panel by PCR     Status: None   Collection Time: 11/20/21 10:30 AM   Specimen: Nasopharyngeal Swab; Respiratory  Result Value Ref Range Status   Adenovirus NOT DETECTED NOT DETECTED Final   Coronavirus 229E NOT DETECTED NOT DETECTED Final    Comment: (NOTE) The Coronavirus on the Respiratory Panel, DOES NOT test for the novel  Coronavirus (2019 nCoV)    Coronavirus HKU1 NOT DETECTED NOT DETECTED Final   Coronavirus NL63 NOT DETECTED NOT DETECTED Final   Coronavirus OC43 NOT DETECTED NOT DETECTED Final   Metapneumovirus NOT DETECTED NOT DETECTED Final   Rhinovirus / Enterovirus NOT DETECTED NOT DETECTED Final   Influenza A NOT DETECTED NOT DETECTED Final   Influenza B NOT DETECTED NOT DETECTED Final   Parainfluenza Virus 1  NOT DETECTED NOT DETECTED Final   Parainfluenza Virus 2 NOT DETECTED NOT DETECTED Final   Parainfluenza Virus 3 NOT DETECTED NOT DETECTED Final   Parainfluenza Virus 4 NOT DETECTED NOT DETECTED Final   Respiratory Syncytial Virus NOT DETECTED NOT DETECTED Final   Bordetella pertussis NOT DETECTED NOT DETECTED Final   Bordetella Parapertussis NOT DETECTED NOT DETECTED Final   Chlamydophila pneumoniae NOT DETECTED NOT DETECTED Final   Mycoplasma pneumoniae NOT DETECTED NOT DETECTED Final    Comment: Performed at [facility], [address], [place], [place] [address]  CSF culture w Gram Stain     Status: None (Preliminary result)   Collection Time: 11/23/21 12:20 PM   Specimen: CSF; Cerebrospinal Fluid  Result Value Ref Range Status   Specimen Description   Final    CSF Performed at [facility], [address] [place], [place] [address]    Special Requests   Final    NONE Performed at [facility], [address] [place], [place] [address]    Gram Stain   Final    WBC SEEN RBC SEEN NO ORGANISMS SEEN Performed at [facility], [address], [place], [place] [address]    Culture   Final    NO GROWTH < 24  HOURS Performed at [facility], [address] [place], [place] [address]    Report Status PENDING  Incomplete    Coagulation Studies: No results for input(s): "LABPROT", "INR" in the last 72 hours.  Urinalysis: No results for input(s): "COLORURINE", "LABSPEC", "PHURINE", "GLUCOSEU", "HGBUR", "BILIRUBINUR", "KETONESUR", "PROTEINUR", "UROBILINOGEN", "NITRITE", "LEUKOCYTESUR" in the last 72 hours.  Invalid input(s): "APPERANCEUR"    Imaging: ECHOCARDIOGRAM COMPLETE  Result Date: 11/23/2021    ECHOCARDIOGRAM REPORT   Patient Name:   Ricky Gross Date of Exam: 11/23/2021 Medical Rec #:  [id number]         Height:       65.0 in Accession #:    [id number]        Weight:       163.0 lb Date of Birth:  20-May-1955          BSA:          1.813 m Patient Age:    66 years          BP:           114/78 mmHg Patient Gender: M                 HR:           86 bpm. Exam Location:  [facility] Procedure: 2D Echo, Cardiac Doppler and Color Doppler Indications:     Fever R50.9  History:         Patient has no prior history of Echocardiogram examinations.                  Risk Factors:Hypertension.  Sonographer:     Cristela Blue Referring Phys:  [id number] VIPUL [facility] Diagnosing Phys: Alwyn Pea MD  Sonographer Comments: Image quality was good. IMPRESSIONS  1. Left ventricular ejection fraction, by estimation, is 55 to 60%. The left ventricle has normal function. The left ventricle has no regional wall motion abnormalities. Left ventricular diastolic parameters were normal.  2. Right ventricular systolic function is normal. The right ventricular size is normal.  3. The mitral valve is normal in structure. No evidence of mitral valve regurgitation.  4. The aortic valve is normal in  structure. Aortic valve regurgitation is not visualized. FINDINGS  Left Ventricle: Left ventricular ejection fraction, by estimation, is 55 to 60%. The left ventricle has normal function. The left ventricle has no regional wall motion  abnormalities. The left ventricular internal cavity size was normal in size. There is  borderline left ventricular hypertrophy. Left ventricular diastolic parameters were normal. Right Ventricle: The right ventricular size is normal. No increase in right ventricular wall thickness. Right ventricular systolic function is normal. Left Atrium: Left atrial size was normal in size. Right Atrium: Right atrial size was normal in size. Pericardium: There is no evidence of pericardial effusion. Mitral Valve: The mitral valve is normal in structure. No evidence of mitral valve regurgitation. Tricuspid Valve: The tricuspid valve is normal in structure. Tricuspid valve regurgitation is trivial. Aortic Valve: The aortic valve is normal in structure. Aortic valve regurgitation is not visualized. Aortic valve mean gradient measures 4.0 mmHg. Aortic valve peak gradient measures 7.7 mmHg. Aortic valve area, by VTI measures 2.46 cm. Pulmonic Valve: The pulmonic valve was normal in structure. Pulmonic valve regurgitation is not visualized. Aorta: The ascending aorta was not well visualized. IAS/Shunts: No atrial level shunt detected by color flow Doppler.  LEFT VENTRICLE PLAX 2D LVIDd:         4.20 cm   Diastology LVIDs:         2.90 cm   LV e' medial:    7.07 cm/s LV PW:         1.30 cm   LV E/e' medial:  13.9 LV IVS:        1.20 cm   LV e' lateral:   10.80 cm/s LVOT diam:     2.00 cm   LV E/e' lateral: 9.1 LV SV:         66 LV SV Index:   36 LVOT Area:     3.14 cm  RIGHT VENTRICLE RV S prime:     12.20 cm/s TAPSE (M-mode): 2.7 cm LEFT ATRIUM             Index        RIGHT ATRIUM           Index LA diam:        3.40 cm 1.87 cm/m   RA Area:     15.00 cm LA Vol (A2C):   28.9 ml 15.94 ml/m  RA Volume:   42.00 ml  23.16 ml/m LA Vol (A4C):   26.2 ml 14.45 ml/m LA Biplane Vol: 29.1 ml 16.05 ml/m  AORTIC VALVE AV Area (Vmax):    2.58 cm AV Area (Vmean):   2.71 cm AV Area (VTI):     2.46 cm AV Vmax:           139.00 cm/s AV Vmean:           90.100 cm/s AV VTI:            0.268 m AV Peak Grad:      7.7 mmHg AV Mean Grad:      4.0 mmHg LVOT Vmax:         114.00 cm/s LVOT Vmean:        77.600 cm/s LVOT VTI:          0.210 m LVOT/AV VTI ratio: 0.78  AORTA Ao Root diam: 3.06 cm MITRAL VALVE               TRICUSPID VALVE MV Area (PHT): 4.54 cm    TR Peak grad:  16.2 mmHg MV Decel Time: 167 msec    TR Vmax:        201.00 cm/s MV E velocity: 98.50 cm/s MV A velocity: 81.80 cm/s  SHUNTS MV E/A ratio:  1.20        Systemic VTI:  0.21 m                            Systemic Diam: 2.00 cm Dwayne D Callwood MD Electronically signed by Yolonda Kida MD Signature Date/Time: 11/23/2021/1:01:40 PM    Final    CT HEAD WO CONTRAST (5MM)  Result Date: 11/22/2021 CLINICAL DATA:  Headache, sudden, severe EXAM: CT HEAD WITHOUT CONTRAST TECHNIQUE: Contiguous axial images were obtained from the base of the skull through the vertex without intravenous contrast. RADIATION DOSE REDUCTION: This exam was performed according to the departmental dose-optimization program which includes automated exposure control, adjustment of the mA and/or kV according to patient size and/or use of iterative reconstruction technique. COMPARISON:  Head CT 08/02/2019. FINDINGS: Brain: No evidence of acute intracranial hemorrhage or extra-axial collection.Patent basal cisterns. No concerning mass effect.The ventricles are normal in size.Scattered subcortical and periventricular white matter hypodensities, nonspecific but likely sequela of chronic small vessel ischemic disease. Vascular: No hyperdense vessel. Skull: Prior suboccipital craniectomy with unchanged catheter terminating in the fourth ventricle. Sinuses/Orbits: Trace right mastoid effusion. Right ethmoid air cell and frontal sinus mucosal thickening. Orbits are unremarkable. Other: None. IMPRESSION: No acute intracranial abnormality. Prior suboccipital craniectomy with unchanged catheter terminating in the fourth ventricle. Mild  sequela of chronic small vessel ischemic disease. Electronically Signed   By: Maurine Simmering M.D.   On: 11/22/2021 16:56     Medications:    doxycycline (VIBRAMYCIN) IV 100 mg (11/24/21 [id number])    aspirin EC  81 mg Oral Daily   gabapentin  300 mg Oral Daily   gabapentin  600 mg Oral BID   pantoprazole  40 mg Oral Daily   pravastatin  20 mg Oral q1800   sodium chloride  1 g Oral TID WC   acetaminophen, HYDROcodone-acetaminophen, metoprolol tartrate, polyethylene glycol  Assessment/ Plan:  Ricky Gross is a 66 y.o.  male with past medical history including hyperlipidemia, hypertension, GERD, and prediabetes, who was admitted to [facility] on 11/19/2021 for Dehydration [E86.0] Hyponatremia [E87.1] Sepsis ([place]) [A41.9]   Hyponatremia likely secondary to SIADH. This is due to the initial correction with normal saline IVF. Sodium corrected to 127 before decreasing again. CT chest negative for lung mass or pneumonia.   Sodium continues to correct to 128.  Continue salt tabs with fluid restriction.  Will recheck sodium at 3 PM.  If sodium 130 or greater, patient cleared for discharge and follow-up with primary care provider.   Acute kidney injury with chronic kidney disease stage IIIa. Baseline creatinine 1.5 with GFR 57 on 08/20/21. Creatinine on admission 2.39. Renal function restored.  Will continue to monitor.  Lab Results  Component Value Date   CREATININE 1.07 11/24/2021   CREATININE 1.29 (H) 11/23/2021   CREATININE 1.57 (H) 11/22/2021    Intake/Output Summary (Last 24 hours) at 11/24/2021 1313 Last data filed at 11/24/2021 0900 Gross per 24 hour  Intake 120 ml  Output 740 ml  Net -620 ml        LOS: 4 Chaden Doom 8/9/20231:13 PM

## 2021-11-24 NOTE — Plan of Care (Signed)
  Problem: Education: Goal: Knowledge of General Education information will improve Description: Including pain rating scale, medication(s)/side effects and non-pharmacologic comfort measures 11/24/2021 1809 by Evelena Peat, RN Outcome: Completed/Met 11/24/2021 0933 by Evelena Peat, RN Outcome: Progressing   Problem: Health Behavior/Discharge Planning: Goal: Ability to manage health-related needs will improve 11/24/2021 1809 by Evelena Peat, RN Outcome: Completed/Met 11/24/2021 0933 by Evelena Peat, RN Outcome: Progressing   Problem: Clinical Measurements: Goal: Ability to maintain clinical measurements within normal limits will improve 11/24/2021 1809 by Evelena Peat, RN Outcome: Completed/Met 11/24/2021 0933 by Evelena Peat, RN Outcome: Progressing Goal: Will remain free from infection 11/24/2021 1809 by Evelena Peat, RN Outcome: Completed/Met 11/24/2021 0933 by Evelena Peat, RN Outcome: Progressing Goal: Diagnostic test results will improve 11/24/2021 1809 by Evelena Peat, RN Outcome: Completed/Met 11/24/2021 0933 by Evelena Peat, RN Outcome: Progressing Goal: Respiratory complications will improve 11/24/2021 1809 by Evelena Peat, RN Outcome: Completed/Met 11/24/2021 0933 by Evelena Peat, RN Outcome: Progressing Goal: Cardiovascular complication will be avoided 11/24/2021 1809 by Evelena Peat, RN Outcome: Completed/Met 11/24/2021 0933 by Evelena Peat, RN Outcome: Progressing

## 2021-11-24 NOTE — Progress Notes (Signed)
Patient discharged to home via self/private transport. PIVX1 removed. No concerns at this  time.

## 2021-11-25 ENCOUNTER — Ambulatory Visit: Payer: Medicare HMO

## 2021-11-25 VITALS — Wt 163.0 lb

## 2021-11-25 DIAGNOSIS — R7612 Nonspecific reaction to cell mediated immunity measurement of gamma interferon antigen response without active tuberculosis: Secondary | ICD-10-CM

## 2021-11-25 NOTE — Progress Notes (Signed)
Patient referral from [facility]. "New pos quant gold. We had him on airborne isolation at the hospital, but there are no AFBs ordered and no mention of TB as part of the differential. Per ED provider note, he has had weight loss of about 20 pounds over the past few weeks along with fevers, night sweats. Out of country travel earlier this summer. Mild subsegmental atelectasis seen on CT scan.  They have sent off a bunch of labs for tickborne and mosquito borne illnesses, including malaria.   After conversation with the ID provider, she did not feel that this was TB. Differential diagnosis included viral fever including dengue, tickborne illness ( ehrlichia, possible malaria infection). He could have aseptic meningitis. Per note "Neuro saw patient and CT done and then LP- it sows very mild lymphocytic pleocytosis and increase in protein- consistent with aseptic meningits". Patient was discharged yesterday. "  Note per Mont Dutton, MPH, MS, CIC   Patient discharged from [facility] on 11/24/21. Patient with +QFT (11/22/21). CT of chest abdomen pelvis (11/21/21), HIV -  (11/19/21). Spoke with wife who reports they went on a cruise (July 17th-23rd, 2023) with approximately 57 other people and wife is finding out that 9 of the 61 have similar symptoms. EPI completed explained the differences between LTBI and Active TB. Explained that nurse will follow-up with Dr Wyvonnia Lora about treatment for LTBI and call them next week.Augustin Schooling, RN

## 2021-11-26 LAB — MISC LABCORP TEST (SEND OUT): Labcorp test code: 138412

## 2021-11-26 LAB — CSF CULTURE W GRAM STAIN: Culture: NO GROWTH

## 2021-11-26 LAB — PLASMODIUM SP. PCR: Plasmodium Sp. PCR: NEGATIVE

## 2021-11-30 ENCOUNTER — Telehealth: Payer: Self-pay | Admitting: Surgery

## 2021-11-30 DIAGNOSIS — I10 Essential (primary) hypertension: Secondary | ICD-10-CM | POA: Diagnosis not present

## 2021-11-30 DIAGNOSIS — Z8619 Personal history of other infectious and parasitic diseases: Secondary | ICD-10-CM | POA: Diagnosis not present

## 2021-11-30 DIAGNOSIS — N1831 Chronic kidney disease, stage 3a: Secondary | ICD-10-CM | POA: Diagnosis not present

## 2021-11-30 DIAGNOSIS — N179 Acute kidney failure, unspecified: Secondary | ICD-10-CM | POA: Diagnosis not present

## 2021-11-30 DIAGNOSIS — E871 Hypo-osmolality and hyponatremia: Secondary | ICD-10-CM | POA: Diagnosis not present

## 2021-11-30 DIAGNOSIS — Z111 Encounter for screening for respiratory tuberculosis: Secondary | ICD-10-CM

## 2021-11-30 LAB — PARASITE EXAM, BLOOD

## 2021-11-30 NOTE — Telephone Encounter (Signed)
Spoke to wife and pt was on call in background, they just saw his PCP, he is very low risk for TB, PCP was asking about repeat testing and I concur. However, will wait 2 weeks until pt is closer to his baseline.   Patient was very ill during hospital admission with septic presentation, acute kidney injury, thrombocytopenia, hyponatremia, elevated lactic acid, headache, fevers and night sweats, but no cough or respiratory symptoms. Patient had been on a cruise where multiple people got ill with similar symptoms.   Negative urine culture, blood cultures, respiratory virus panel negative. Meningitis/encephalitis panel negative, CSF culture negative in 24 hours. CSF with mild pleocytosis with WBCs of 9, 77% neutrophil and mildly elevated protein at 80, CMV negative, consistent with aseptic meningitis.  CXR and Chest CT scan are both negative/normal for infectious process.  Needs repeat CBC and CMP, follow up outpatient visit notes from nephrology and PCP to help with further planning.  Scheduled no charge QFT 12/14/2021 9:30am.  Jennye Moccasin, MD

## 2021-12-07 LAB — MISC LABCORP TEST (SEND OUT)
Labcorp test code: 9985
Labcorp test code: 9985

## 2021-12-14 ENCOUNTER — Other Ambulatory Visit (LOCAL_COMMUNITY_HEALTH_CENTER): Payer: Medicare HMO

## 2021-12-14 DIAGNOSIS — Z111 Encounter for screening for respiratory tuberculosis: Secondary | ICD-10-CM

## 2021-12-14 NOTE — Progress Notes (Signed)
In nurse clinic for repeat QFT gold test per order by Dr. Irena Cords.  Recent positive QFT.  See Tb flow sheet.  ROI signed.  Dr. Wyvonnia Lora came to clinic and spoke with pt.   Walked pt to lab for [employer].    Cherlynn Polo, RN

## 2021-12-16 ENCOUNTER — Encounter: Payer: Self-pay | Admitting: Infectious Diseases

## 2021-12-16 ENCOUNTER — Ambulatory Visit: Payer: BC Managed Care – PPO | Attending: Infectious Diseases | Admitting: Infectious Diseases

## 2021-12-16 VITALS — BP 128/78 | HR 86 | Temp 98.1°F | Ht 65.0 in | Wt 168.0 lb

## 2021-12-16 DIAGNOSIS — R509 Fever, unspecified: Secondary | ICD-10-CM | POA: Diagnosis not present

## 2021-12-16 DIAGNOSIS — E871 Hypo-osmolality and hyponatremia: Secondary | ICD-10-CM | POA: Diagnosis not present

## 2021-12-16 DIAGNOSIS — E7800 Pure hypercholesterolemia, unspecified: Secondary | ICD-10-CM | POA: Insufficient documentation

## 2021-12-16 DIAGNOSIS — I10 Essential (primary) hypertension: Secondary | ICD-10-CM | POA: Diagnosis not present

## 2021-12-16 DIAGNOSIS — R7303 Prediabetes: Secondary | ICD-10-CM | POA: Diagnosis not present

## 2021-12-16 DIAGNOSIS — G030 Nonpyogenic meningitis: Secondary | ICD-10-CM | POA: Insufficient documentation

## 2021-12-16 NOTE — Patient Instructions (Signed)
You are here for follow uop after recent hospitlaization for fever, head ache- you had features of aseptic meningitis. Your labs were negative  chikungya, malaria, dengue were negative. All virus test were neg. You had a positive qunatiferon gold- you are checking with health dept . Follow PRN .

## 2021-12-16 NOTE — Progress Notes (Signed)
NAME: Ricky Gross  DOB: 04/05/1956  MRN: [id number]  Date/Time: 12/16/2021 10:46 AM  Subjective:  Here with his wife KIARA KEEP is a 66 y.o.male  with a history of hypertension, prediabetes, hypercholesterolemia was recently in the hospital for fever headache after a cruise ship vacation HE had LP which showed minimally elevated wbc at 9 ( 77% N), protein elevated at 80. Was diagnosed as aseptic meningitis due to possible viral illness Neg dengue, chikungunya, csf ME panel, HIV, ehrlichia  H did receiv 5 days of Doxy He also had hyponatremia and AKI and was followed by renal Is still on salt tablets with fluid restriction and followed as Op by them HE is doing much better No fever  No headache Back to near normal activities   Past Medical History:  Diagnosis Date   GERD (gastroesophageal reflux disease)    Hypertension     No past surgical history on file.  Social History   Socioeconomic History   Marital status: Married    Spouse name: Not on file   Number of children: Not on file   Years of education: Not on file   Highest education level: Not on file  Occupational History   Not on file  Tobacco Use   Smoking status: Former    Types: Cigarettes    Quit date: 20    Years since quitting: 47.6   Smokeless tobacco: Never  Vaping Use   Vaping Use: Never used  Substance and Sexual Activity   Alcohol use: Not on file    Comment: wife states patient drinks 2 beers a day, but has not drank any beer over the past month   Drug use: Never   Sexual activity: Not on file  Other Topics Concern   Not on file  Social History Narrative   Not on file   Social Determinants of Health   Financial Resource Strain: Not on file  Food Insecurity: Not on file  Transportation Needs: Not on file  Physical Activity: Not on file  Stress: Not on file  Social Connections: Not on file  Intimate Partner Violence: Not on file    No family history on file. No Known  Allergies I? Current Outpatient Medications  Medication Sig Dispense Refill   aspirin EC 81 MG tablet Take 1 tablet by mouth daily.     gabapentin (NEURONTIN) 300 MG capsule Take 1-2 capsules by mouth 3 (three) times daily.  2 po q am, 1 po q afternoon, 2 po q hs     lisinopril-hydrochlorothiazide (ZESTORETIC) 10-12.5 MG tablet Take 1 tablet by mouth daily.     lovastatin (MEVACOR) 20 MG tablet Take 1 tablet by mouth at bedtime.     omeprazole (PRILOSEC) 20 MG capsule Take 20 mg by mouth daily.     sodium chloride 1 g tablet Take 1 tablet (1 g total) by mouth 3 (three) times daily with meals. 90 tablet 0   No current facility-administered medications for this visit.     Abtx:  Anti-infectives (From admission, onward)    None       REVIEW OF SYSTEMS:  Const: negative fever, negative chills, negative weight loss Eyes: negative diplopia or visual changes, negative eye pain ENT: negative coryza, negative sore throat Resp: negative cough, hemoptysis, dyspnea Cards: negative for chest pain, palpitations, lower extremity edema GU: negative for frequency, dysuria and hematuria GI: Negative for abdominal pain, diarrhea, bleeding, constipation Skin: negative for rash and pruritus Heme: negative for easy bruising  and gum/nose bleeding MS: negative for myalgias, arthralgias, back pain and muscle weakness Neurolo:negative for headaches, dizziness, vertigo, memory problems  Psych: negative for feelings of anxiety, depression  Endocrine: negative for thyroid, diabetes Allergy/Immunology- negative for any medication or food allergies ?  Objective:  VITALS:  BP 128/78   Pulse 86   Temp 98.1 F (36.7 C) (Temporal)   Ht 5\' 5"  (1.651 m)   Wt 168 lb (76.2 kg)   BMI 27.96 kg/m   PHYSICAL EXAM:  General: Alert, cooperative, no distress, appears stated age.  Head: Normocephalic, without obvious abnormality, atraumatic. Eyes: Conjunctivae clear, anicteric sclerae. Pupils are equal ENT  Nares normal. No drainage or sinus tenderness. Lips, mucosa, and tongue normal. No Thrush Neck: Supple, symmetrical, no adenopathy, thyroid: non tender no carotid bruit and no JVD. Back: No CVA tenderness. Lungs: Clear to auscultation bilaterally. No Wheezing or Rhonchi. No rales. Heart: Regular rate and rhythm, no murmur, rub or gallop. Abdomen: Soft, non-tender,not distended. Bowel sounds normal. No masses Extremities: atraumatic, no cyanosis. No edema. No clubbing Skin: No rashes or lesions. Or bruising Lymph: Cervical, supraclavicular normal. Neurologic: Grossly non-focal Pertinent Labs Malaria PCR neg Dengue, chikungunya neg Ehrlichia neg ME panel CSF neg RVP panel NP swab neg  Impression/recommendation Febrile illness with mild aspetic meningitis like picture with no etiology identified, but thought to be viral illness as 9 other members of the cruise group were sick according to his wife Resolved completely  Hyponatremia complicated the above leading to headache and some confusion Much better Followed by nephrologist  HTN on meds  Discussed all the labs with him and his wife follow PRN ?  Note:  This document was prepared using Dragon voice recognition software and may include unintentional dictation errors.

## 2021-12-18 LAB — QUANTIFERON-TB GOLD PLUS
QuantiFERON Mitogen Value: >10 IU/mL
QuantiFERON Nil Value: >10 IU/mL
QuantiFERON TB1 Ag Value: >10 IU/mL
QuantiFERON TB2 Ag Value: >10 IU/mL
QuantiFERON-TB Gold Plus: UNDETERMINED — AB

## 2021-12-21 ENCOUNTER — Other Ambulatory Visit: Payer: Medicare HMO | Admitting: Surgery

## 2021-12-21 DIAGNOSIS — R7612 Nonspecific reaction to cell mediated immunity measurement of gamma interferon antigen response without active tuberculosis: Secondary | ICD-10-CM

## 2021-12-21 NOTE — Progress Notes (Signed)
Patient last QFT was indeterminate, cannot use results of test, must repeat.  No charge QFT for patient, explained indeterminate test results to patient, he is happy to repeat test.   Offer LTBI pending results.  Jennye Moccasin, MD

## 2021-12-22 DIAGNOSIS — E7800 Pure hypercholesterolemia, unspecified: Secondary | ICD-10-CM | POA: Diagnosis not present

## 2021-12-22 DIAGNOSIS — R7303 Prediabetes: Secondary | ICD-10-CM | POA: Diagnosis not present

## 2021-12-22 DIAGNOSIS — N289 Disorder of kidney and ureter, unspecified: Secondary | ICD-10-CM | POA: Diagnosis not present

## 2021-12-22 DIAGNOSIS — I10 Essential (primary) hypertension: Secondary | ICD-10-CM | POA: Diagnosis not present

## 2021-12-23 DIAGNOSIS — E871 Hypo-osmolality and hyponatremia: Secondary | ICD-10-CM | POA: Diagnosis not present

## 2021-12-23 DIAGNOSIS — I10 Essential (primary) hypertension: Secondary | ICD-10-CM | POA: Diagnosis not present

## 2021-12-23 DIAGNOSIS — N182 Chronic kidney disease, stage 2 (mild): Secondary | ICD-10-CM | POA: Diagnosis not present

## 2021-12-24 ENCOUNTER — Telehealth: Payer: Self-pay | Admitting: Surgery

## 2021-12-24 DIAGNOSIS — R7612 Nonspecific reaction to cell mediated immunity measurement of gamma interferon antigen response without active tuberculosis: Secondary | ICD-10-CM

## 2021-12-24 LAB — QUANTIFERON-TB GOLD PLUS
QuantiFERON Mitogen Value: >10 IU/mL
QuantiFERON Nil Value: >10 IU/mL
QuantiFERON TB1 Ag Value: >10 IU/mL
QuantiFERON TB2 Ag Value: >10 IU/mL
QuantiFERON-TB Gold Plus: UNDETERMINED — AB

## 2021-12-24 NOTE — Telephone Encounter (Signed)
Spoke w/pt, informed of yet another indeterminate result for his QFT. Will call him back after weekend with plan for next steps, very unusual for 2 indeterminates, need to check with lab if following protocol, any explanation for results.  Jennye Moccasin, MD

## 2021-12-27 ENCOUNTER — Telehealth: Payer: Self-pay | Admitting: Surgery

## 2021-12-27 DIAGNOSIS — Z111 Encounter for screening for respiratory tuberculosis: Secondary | ICD-10-CM

## 2021-12-27 NOTE — Telephone Encounter (Signed)
Pt had sudden severe illness while on cruise (many others sick too), septic presentation w/some resp symptoms, during workup in hospital patient had QFT drawn that was positive. Repeat QFTs both indeterminate, thus scheduled for PPD 01/10/2022, no charge.  Patient is low risk for TB, but if PPD+ offer tx for LTBI, CXR is normal.  Wyvonnia Lora, Alessandra Bevels, MD

## 2021-12-28 DIAGNOSIS — I10 Essential (primary) hypertension: Secondary | ICD-10-CM | POA: Diagnosis not present

## 2021-12-28 DIAGNOSIS — Z0000 Encounter for general adult medical examination without abnormal findings: Secondary | ICD-10-CM | POA: Diagnosis not present

## 2021-12-28 DIAGNOSIS — D72819 Decreased white blood cell count, unspecified: Secondary | ICD-10-CM | POA: Diagnosis not present

## 2021-12-28 DIAGNOSIS — D649 Anemia, unspecified: Secondary | ICD-10-CM | POA: Diagnosis not present

## 2021-12-28 DIAGNOSIS — Z1331 Encounter for screening for depression: Secondary | ICD-10-CM | POA: Diagnosis not present

## 2021-12-28 DIAGNOSIS — R7303 Prediabetes: Secondary | ICD-10-CM | POA: Diagnosis not present

## 2022-01-10 ENCOUNTER — Ambulatory Visit (LOCAL_COMMUNITY_HEALTH_CENTER): Payer: Self-pay

## 2022-01-10 DIAGNOSIS — Z111 Encounter for screening for respiratory tuberculosis: Secondary | ICD-10-CM

## 2022-01-10 NOTE — Progress Notes (Signed)
In nurse clinic for ppd  (and no charge) per Dr Lolita Lenz. Josie Saunders, RN

## 2022-01-13 ENCOUNTER — Ambulatory Visit (LOCAL_COMMUNITY_HEALTH_CENTER): Payer: Self-pay

## 2022-01-13 DIAGNOSIS — Z111 Encounter for screening for respiratory tuberculosis: Secondary | ICD-10-CM

## 2022-01-13 DIAGNOSIS — Z113 Encounter for screening for infections with a predominantly sexual mode of transmission: Secondary | ICD-10-CM

## 2022-01-13 DIAGNOSIS — R7611 Nonspecific reaction to tuberculin skin test without active tuberculosis: Secondary | ICD-10-CM

## 2022-01-13 LAB — TB SKIN TEST
Induration: 15 mm
TB Skin Test: POSITIVE

## 2022-01-13 LAB — HM HIV SCREENING LAB: HM HIV Screening: NEGATIVE

## 2022-01-13 NOTE — Progress Notes (Signed)
Client seen in nurse clinic for PPD reading.  Results at 35mm - Positive.  Dr. Lolita Lenz made aware.  Patient counseled regarding Latent TB vs Active TB. Dr. Lolita Lenz ordered LFT, CBC, HIV and Syphilis.  All labs accepted.   HIV consent obtained.    Jovian Lembcke Shelda Pal, RN

## 2022-01-14 ENCOUNTER — Telehealth: Payer: Self-pay | Admitting: Surgery

## 2022-01-14 DIAGNOSIS — R7612 Nonspecific reaction to cell mediated immunity measurement of gamma interferon antigen response without active tuberculosis: Secondary | ICD-10-CM

## 2022-01-14 LAB — CBC WITH DIFFERENTIAL/PLATELET
Basophils Absolute: 0 10*3/uL (ref 0.0–0.2)
Basos: 1 %
EOS (ABSOLUTE): 0.2 10*3/uL (ref 0.0–0.4)
Eos: 5 %
Hematocrit: 42.7 % (ref 37.5–51.0)
Hemoglobin: 13.8 g/dL (ref 13.0–17.7)
Immature Grans (Abs): 0 10*3/uL (ref 0.0–0.1)
Immature Granulocytes: 0 %
Lymphocytes Absolute: 1.3 10*3/uL (ref 0.7–3.1)
Lymphs: 40 %
MCH: 30 pg (ref 26.6–33.0)
MCHC: 32.3 g/dL (ref 31.5–35.7)
MCV: 93 fL (ref 79–97)
Monocytes Absolute: 0.3 10*3/uL (ref 0.1–0.9)
Monocytes: 9 %
Neutrophils Absolute: 1.4 10*3/uL (ref 1.4–7.0)
Neutrophils: 45 %
Platelets: 253 10*3/uL (ref 150–450)
RBC: 4.6 x10E6/uL (ref 4.14–5.80)
RDW: 14.5 % (ref 11.6–15.4)
WBC: 3.2 10*3/uL — ABNORMAL LOW (ref 3.4–10.8)

## 2022-01-14 LAB — HEPATIC FUNCTION PANEL
ALT: 13 IU/L (ref 0–44)
AST: 20 IU/L (ref 0–40)
Albumin: 4.9 g/dL (ref 3.9–4.9)
Alkaline Phosphatase: 70 IU/L (ref 44–121)
Bilirubin Total: 0.5 mg/dL (ref 0.0–1.2)
Bilirubin, Direct: 0.13 mg/dL (ref 0.00–0.40)
Total Protein: 7.5 g/dL (ref 6.0–8.5)

## 2022-01-14 NOTE — Telephone Encounter (Signed)
Called to inform pt LFTs are perfect/back to normal, CBC looks good too and to discuss whether or not he would like to take abx tx for LTBI since his f/u PPD was positive.   Reassured he is not sick nor contagious w/active TB. Will call him back later today.  Leigh Aurora, MD

## 2022-01-14 NOTE — Telephone Encounter (Signed)
Patient called back, he would like to start treatment for LTBI with Rifampin 600mg  daily for 4 months.   Start appointment scheduled for Monday, October 9th at 3:00pm.  Leigh Aurora, MD

## 2022-01-23 ENCOUNTER — Other Ambulatory Visit: Payer: Self-pay | Admitting: Surgery

## 2022-01-23 DIAGNOSIS — R7612 Nonspecific reaction to cell mediated immunity measurement of gamma interferon antigen response without active tuberculosis: Secondary | ICD-10-CM

## 2022-01-23 NOTE — Progress Notes (Signed)
The patient has been diagnosed with latent TB.  +QFT: 11/22/2021 CXR: negative 11/19/2021 EPI: 11/25/2021  HIV: neg 01/13/2022 Syphilis: neg 01/13/2022 CBC: wnl 01/13/2022 except slightly low WBC LFTs: wnl 01/13/2022  Tuberculosis treatment orders  All patients are to be monitored per [place] and county TB policies.   ___[name]___ has latent TB. Treat for latent TB per the following:  Rifampin 600mg  daily by mouth x 4 months per Dr. Ernestina Patches standing orders ([facility]).   CBC and LFTs already obtained, LFTs are back to normal/baseline after recent illness in August.   No labs needed at start appointment.  Needs monthly LFTs thereafter as patient is on a statin, and has history of recent elevation of LFTs during acute illness. No need for monthly CBC unless concerning symptoms arise.  Leigh Aurora, MD

## 2022-01-24 ENCOUNTER — Ambulatory Visit (LOCAL_COMMUNITY_HEALTH_CENTER): Payer: Medicare HMO

## 2022-01-24 DIAGNOSIS — Z23 Encounter for immunization: Secondary | ICD-10-CM

## 2022-01-24 DIAGNOSIS — R7612 Nonspecific reaction to cell mediated immunity measurement of gamma interferon antigen response without active tuberculosis: Secondary | ICD-10-CM

## 2022-01-24 DIAGNOSIS — Z719 Counseling, unspecified: Secondary | ICD-10-CM

## 2022-01-24 MED ORDER — RIFAMPIN 300 MG PO CAPS: 600.0000 mg | ORAL_CAPSULE | Freq: Every day | ORAL | 0 refills | Status: AC

## 2022-01-24 NOTE — Progress Notes (Signed)
  Are you feeling sick today? No   Have you ever received a dose of COVID-19 Vaccine? [employer], [place], [place], [place], Other) Yes  If yes, which vaccine and how many doses?   PFIZER, 6   Did you bring the vaccination record card or other documentation?  Yes   Do you have a health condition or are undergoing treatment that makes you moderately or severely immunocompromised? This would include, but not be limited to: cancer, HIV, organ transplant, immunosuppressive therapy/high-dose corticosteroids, or moderate/severe primary immunodeficiency.  No  Have you received COVID-19 vaccine before or during hematopoietic cell transplant (HCT) or CAR-T-cell therapies? No  Have you ever had an allergic reaction to: (This would include a severe allergic reaction or a reaction that caused hives, swelling, or respiratory distress, including wheezing.) A component of a COVID-19 vaccine or a previous dose of COVID-19 vaccine? No   Have you ever had an allergic reaction to another vaccine (other thanCOVID-19 vaccine) or an injectable medication? (This would include a severe allergic reaction or a reaction that caused hives, swelling, or respiratory distress, including wheezing.)   No    Do you have a history of any of the following:  Myocarditis or Pericarditis No  Dermal fillers:  No  Multisystem Inflammatory Syndrome (MIS-C or MIS-A)? No  COVID-19 disease within the past 3 months? No  Vaccinated with monkeypox vaccine in the last 4 weeks? No  Pt was eligible and administered Pfizer Covid 19, 12+ Comirnaty per pt request. Tolerated well. Given VIS, [facility] copy, and covid vaccine record card, verbalized understanding. M.Riya Huxford, LPN.

## 2022-01-24 NOTE — Progress Notes (Addendum)
Patient referred by Dr Netty Starring for +QFT.   +QFT: 11/22/2021 CXR: negative 11/19/2021 EPI: 11/25/2021   HIV: neg 01/13/2022 Syphilis: neg 01/13/2022 CBC: wnl 01/13/2022 except slightly low WBC LFTs: wnl 01/13/2022  Discussed latent vs active TB with patient. Rifampin drug information sheet reviewed with patient and copy given. Patient agreed to start Rifampin 600 mg daily for 4 months. Patient signed consent. Patient advised to not drink alcohol or smoke cigars while taking Rifampin. Patient agreed to stop during treatment. Reviewed side effects of medication and instructed patient if side effects occur to stop medication and contact TB coordinator. Card given. Explain to take medication the same time each day and not to double up if a dose is missed. Patient understood.   Explained that LFTs will be drawn at each subsequent visit due to statin patient is on. Informed patient that medicine will turn urine orange/red and not to be concerned, but if it is dark brown to stop medication and contact us. Patient understood.   The patient was dispensed Rifampin 300 mg #60 per Dr Louretta Parma orders today. I provided counseling today regarding the medication. We discussed the medication, the side effects and when to call clinic. Patient given the opportunity to ask questions. Questions answered.   Patient requested to get the COVID vaccine booster at this visit. Vaccine nurse informed of request. All questions answered at this time.   Patient scheduled for #2 Rifampin on November 3rd at 9 AM.

## 2022-01-25 ENCOUNTER — Telehealth: Payer: Self-pay

## 2022-01-25 DIAGNOSIS — E871 Hypo-osmolality and hyponatremia: Secondary | ICD-10-CM | POA: Diagnosis not present

## 2022-01-25 NOTE — Telephone Encounter (Signed)
Patient called and left message that he started his first dose of medication this morning and had a question for the nurse.   Returned call to patient. Patient reports that he noted his urine was gold this morning. Nurse reassured him that his urine would change color and that we would be concerned if it was dark brown. Patient had no other questions.  Servando Salina, RN

## 2022-02-02 DIAGNOSIS — E871 Hypo-osmolality and hyponatremia: Secondary | ICD-10-CM | POA: Diagnosis not present

## 2022-02-02 DIAGNOSIS — N182 Chronic kidney disease, stage 2 (mild): Secondary | ICD-10-CM | POA: Diagnosis not present

## 2022-02-02 DIAGNOSIS — I10 Essential (primary) hypertension: Secondary | ICD-10-CM | POA: Diagnosis not present

## 2022-02-03 NOTE — Addendum Note (Signed)
Addended by: Cletis Media on: 02/03/2022 01:31 PM   Modules accepted: Orders

## 2022-02-18 ENCOUNTER — Ambulatory Visit (LOCAL_COMMUNITY_HEALTH_CENTER): Payer: Medicare HMO

## 2022-02-18 VITALS — Wt 174.5 lb

## 2022-02-18 DIAGNOSIS — R7612 Nonspecific reaction to cell mediated immunity measurement of gamma interferon antigen response without active tuberculosis: Secondary | ICD-10-CM

## 2022-02-18 MED ORDER — RIFAMPIN 300 MG PO CAPS: 600.0000 mg | ORAL_CAPSULE | Freq: Every day | ORAL | 0 refills | Status: DC

## 2022-02-18 NOTE — Progress Notes (Addendum)
In nurse clinic for LTBI/ TB Med management/ Rifampin #2  Pt taking Rifampin daily and denies missing any pills. Has 2 days worth of Rifampin left. Takes Rifampin daily at 530 am without food. Complains of heartburn after taking Rifampin and says he has increased his Prilosec.  Also, experiences "stiff legs" when awakes in morning, but doesn't last long. Denies alcohol.   Phone consult with Dr Westley Foots who recommends pt to continue Rifampin and to take Rifampin with food and drink adequate water. Recommends pt to not increase prilosec.   RN discussed provider recommendations and assessed pt eating patterns. Pt eats lightly for breakfast (banana) and larger meal at lunch. Discussed with pt to take Rifampin with lunch to assess if heartburn improves. Pt in agreement.   TB contact card given and advised to contact [facility] with questions, concerns, side effects.  Rifampin info sheet given.   The patient was dispensed Rifampin 300 mg #60 today per [facility] Dr Vertell Novak. Instructions reviewed. I provided counseling today regarding the medication. We discussed the medication, the side effects and when to call clinic. Patient given the opportunity to ask questions. Questions answered.    Pt wants to have pneumonia vaccine per his PCP recommendation. Plans to have vaccine at next TB med appt.   RN walked pt to lab for LFT's today.   Next TB Med appt and immunization appt 03/18/2022 arrival 8:30 am. Has appt card. Josie Saunders, RN

## 2022-02-19 LAB — HEPATIC FUNCTION PANEL
ALT: 15 IU/L (ref 0–44)
AST: 23 IU/L (ref 0–40)
Albumin: 4.5 g/dL (ref 3.9–4.9)
Alkaline Phosphatase: 65 IU/L (ref 44–121)
Bilirubin Total: 0.3 mg/dL (ref 0.0–1.2)
Bilirubin, Direct: 0.14 mg/dL (ref 0.00–0.40)
Total Protein: 6.8 g/dL (ref 6.0–8.5)

## 2022-03-18 ENCOUNTER — Ambulatory Visit (LOCAL_COMMUNITY_HEALTH_CENTER): Payer: Medicare HMO

## 2022-03-18 ENCOUNTER — Ambulatory Visit (LOCAL_COMMUNITY_HEALTH_CENTER): Payer: Self-pay

## 2022-03-18 VITALS — Wt 168.5 lb

## 2022-03-18 DIAGNOSIS — Z23 Encounter for immunization: Secondary | ICD-10-CM | POA: Diagnosis not present

## 2022-03-18 DIAGNOSIS — Z719 Counseling, unspecified: Secondary | ICD-10-CM

## 2022-03-18 DIAGNOSIS — R7612 Nonspecific reaction to cell mediated immunity measurement of gamma interferon antigen response without active tuberculosis: Secondary | ICD-10-CM

## 2022-03-18 MED ORDER — RIFAMPIN 300 MG PO CAPS: 600.0000 mg | ORAL_CAPSULE | Freq: Every day | ORAL | 0 refills | Status: AC

## 2022-03-18 NOTE — Progress Notes (Signed)
Patient in Clinic today for LTBI TB med Rifampin #3. Patient denies changes to medications, no new allergies and is not smoking or drinking. Patient mentioned that he may have 1-2 beers on Superbowl Sunday, but otherwise is not drinking.   Patient reports he had a large bruise on his right arm after last month's LFT blood draw. RN informed patient to apply pressure longer at the needle site where his LFTs will be drawn today. Patient reports no other bleeding or bruising.  RN dispensed Rifampin 300 mg #60 per Dr Olmito Blas [facility]. Patient walked to lab after receiving his pneumonia vaccine.Next appointment for Rifampin #4 scheduled for April 22, 2022 at 8:20 AM.   Augustin Schooling, RN

## 2022-03-18 NOTE — Progress Notes (Signed)
Pt requested pneumonia vaccine. Eligible for PCV 20 per standing order. Administered, tolerated well. M.Kery Haltiwanger, LPN.

## 2022-03-19 LAB — HEPATIC FUNCTION PANEL
ALT: 17 IU/L (ref 0–44)
AST: 17 IU/L (ref 0–40)
Albumin: 4.8 g/dL (ref 3.9–4.9)
Alkaline Phosphatase: 78 IU/L (ref 44–121)
Bilirubin Total: 0.2 mg/dL (ref 0.0–1.2)
Bilirubin, Direct: 0.12 mg/dL (ref 0.00–0.40)
Total Protein: 7.1 g/dL (ref 6.0–8.5)

## 2022-03-21 ENCOUNTER — Telehealth: Payer: Self-pay

## 2022-03-21 NOTE — Telephone Encounter (Signed)
Call to patient to report LFTs drawn on 03/18/2022 were wnl and to continue taking Rifampin as directed. Patient reports he is doing great. Augustin Schooling, RN

## 2022-03-22 DIAGNOSIS — D72819 Decreased white blood cell count, unspecified: Secondary | ICD-10-CM | POA: Diagnosis not present

## 2022-03-22 DIAGNOSIS — R7303 Prediabetes: Secondary | ICD-10-CM | POA: Diagnosis not present

## 2022-03-22 DIAGNOSIS — E7800 Pure hypercholesterolemia, unspecified: Secondary | ICD-10-CM | POA: Diagnosis not present

## 2022-03-29 DIAGNOSIS — E7800 Pure hypercholesterolemia, unspecified: Secondary | ICD-10-CM | POA: Diagnosis not present

## 2022-03-29 DIAGNOSIS — K219 Gastro-esophageal reflux disease without esophagitis: Secondary | ICD-10-CM | POA: Diagnosis not present

## 2022-03-29 DIAGNOSIS — R7303 Prediabetes: Secondary | ICD-10-CM | POA: Diagnosis not present

## 2022-03-29 DIAGNOSIS — D72819 Decreased white blood cell count, unspecified: Secondary | ICD-10-CM | POA: Diagnosis not present

## 2022-03-29 DIAGNOSIS — I10 Essential (primary) hypertension: Secondary | ICD-10-CM | POA: Diagnosis not present

## 2022-04-22 ENCOUNTER — Ambulatory Visit (LOCAL_COMMUNITY_HEALTH_CENTER): Payer: Medicare HMO

## 2022-04-22 VITALS — Wt 167.0 lb

## 2022-04-22 DIAGNOSIS — R7612 Nonspecific reaction to cell mediated immunity measurement of gamma interferon antigen response without active tuberculosis: Secondary | ICD-10-CM

## 2022-04-22 MED ORDER — RIFAMPIN 300 MG PO CAPS: 600.0000 mg | ORAL_CAPSULE | Freq: Every day | ORAL | 0 refills | Status: AC

## 2022-04-22 NOTE — Progress Notes (Signed)
In nurse clinic for LTBI treatment and medication management/Rifampin #4/completion.  Denies any problems or complaints.  States takes pills daily in the am and has 2 days (4 pills) remaining in current bottle.   Yellow Tb card given and instructed pt to always keep this card.  Congratulations completion letter given and reviewed with pt to never receive a Tb skin test in the future and to report any s/s active Tb (reviewed with pt).  Pt expressed understanding. Tb coordinator will fax completion letter to pt's PCP. The patient was dispensed Rifampin 300 mg (#60) bottle #4 today per standing order by Lubertha Sayres, MD.  I provided counseling today regarding the medication. We discussed the medication, the side effects and when to call clinic. Patient given the opportunity to ask questions. Questions answered.  Instructed to complete all of the medication.   Pt has Tb coord contact number and declined Rifampin info sheet (has at home). Walked pt to lab for monthly LFT.  Tonny Branch, RN

## 2022-04-23 LAB — HEPATIC FUNCTION PANEL
ALT: 14 IU/L (ref 0–44)
AST: 19 IU/L (ref 0–40)
Albumin: 4.4 g/dL (ref 3.9–4.9)
Alkaline Phosphatase: 81 IU/L (ref 44–121)
Bilirubin Total: 0.5 mg/dL (ref 0.0–1.2)
Bilirubin, Direct: 0.22 mg/dL (ref 0.00–0.40)
Total Protein: 6.8 g/dL (ref 6.0–8.5)

## 2022-04-25 ENCOUNTER — Telehealth: Payer: Self-pay

## 2022-04-25 NOTE — Telephone Encounter (Signed)
Call to patient to report that LFTs are WNL. Patient instructed to complete last bottle of Rifampin. Patient questioned if he needed to return for another visit.RN informed him he did not need to return for another visit.   Servando Salina, RN

## 2022-04-25 NOTE — Telephone Encounter (Signed)
Call from patient questioning if he should take the RSV vaccine.  Nurse explained to patient to contact his PCP to discuss whether he should take the vaccine. Servando Salina, RN

## 2022-04-26 DIAGNOSIS — I10 Essential (primary) hypertension: Secondary | ICD-10-CM | POA: Diagnosis not present

## 2022-04-26 DIAGNOSIS — E871 Hypo-osmolality and hyponatremia: Secondary | ICD-10-CM | POA: Diagnosis not present

## 2022-04-26 DIAGNOSIS — N182 Chronic kidney disease, stage 2 (mild): Secondary | ICD-10-CM | POA: Diagnosis not present

## 2022-05-11 DIAGNOSIS — H35033 Hypertensive retinopathy, bilateral: Secondary | ICD-10-CM | POA: Diagnosis not present

## 2022-05-11 DIAGNOSIS — H524 Presbyopia: Secondary | ICD-10-CM | POA: Diagnosis not present

## 2022-06-04 LAB — GLUCOSE, POCT (MANUAL RESULT ENTRY): Glucose Fasting, POC: 93 mg/dL (ref 70–99)

## 2022-06-04 NOTE — Progress Notes (Signed)
Pt is fasting

## 2022-06-13 ENCOUNTER — Encounter: Payer: Self-pay | Admitting: *Deleted

## 2022-06-13 NOTE — Progress Notes (Signed)
Per chart review, pt has established PCP and Nephrologist. EPIC in basket message enabled for nephrologist with whom pt has appt on 06/14/22, so message sent to Dr. Holley Raring today about pt's elevated b/p of 160/90, during the 06/04/22 screening event. Pt also has PCP appt with [facility] (for whom EPIC in basket messaging is not availble) on 07/01/22. No additional health equity team support indicated at this time.

## 2022-06-14 DIAGNOSIS — E871 Hypo-osmolality and hyponatremia: Secondary | ICD-10-CM | POA: Diagnosis not present

## 2022-06-14 DIAGNOSIS — N182 Chronic kidney disease, stage 2 (mild): Secondary | ICD-10-CM | POA: Diagnosis not present

## 2022-06-14 DIAGNOSIS — I10 Essential (primary) hypertension: Secondary | ICD-10-CM | POA: Diagnosis not present

## 2022-06-24 DIAGNOSIS — R7303 Prediabetes: Secondary | ICD-10-CM | POA: Diagnosis not present

## 2022-06-24 DIAGNOSIS — E7800 Pure hypercholesterolemia, unspecified: Secondary | ICD-10-CM | POA: Diagnosis not present

## 2022-06-24 DIAGNOSIS — I10 Essential (primary) hypertension: Secondary | ICD-10-CM | POA: Diagnosis not present

## 2022-06-24 DIAGNOSIS — D72819 Decreased white blood cell count, unspecified: Secondary | ICD-10-CM | POA: Diagnosis not present

## 2022-06-30 DIAGNOSIS — R69 Illness, unspecified: Secondary | ICD-10-CM | POA: Diagnosis not present

## 2022-07-01 DIAGNOSIS — K219 Gastro-esophageal reflux disease without esophagitis: Secondary | ICD-10-CM | POA: Diagnosis not present

## 2022-07-01 DIAGNOSIS — D72819 Decreased white blood cell count, unspecified: Secondary | ICD-10-CM | POA: Diagnosis not present

## 2022-07-01 DIAGNOSIS — Z862 Personal history of diseases of the blood and blood-forming organs and certain disorders involving the immune mechanism: Secondary | ICD-10-CM | POA: Diagnosis not present

## 2022-07-01 DIAGNOSIS — E7800 Pure hypercholesterolemia, unspecified: Secondary | ICD-10-CM | POA: Diagnosis not present

## 2022-07-01 DIAGNOSIS — R7303 Prediabetes: Secondary | ICD-10-CM | POA: Diagnosis not present

## 2022-07-01 DIAGNOSIS — I10 Essential (primary) hypertension: Secondary | ICD-10-CM | POA: Diagnosis not present

## 2022-07-14 DIAGNOSIS — R69 Illness, unspecified: Secondary | ICD-10-CM | POA: Diagnosis not present

## 2022-08-31 DIAGNOSIS — R69 Illness, unspecified: Secondary | ICD-10-CM | POA: Diagnosis not present

## 2022-10-14 DIAGNOSIS — R7303 Prediabetes: Secondary | ICD-10-CM | POA: Diagnosis not present

## 2022-10-14 DIAGNOSIS — E7800 Pure hypercholesterolemia, unspecified: Secondary | ICD-10-CM | POA: Diagnosis not present

## 2022-10-14 DIAGNOSIS — I10 Essential (primary) hypertension: Secondary | ICD-10-CM | POA: Diagnosis not present

## 2022-10-14 DIAGNOSIS — Z862 Personal history of diseases of the blood and blood-forming organs and certain disorders involving the immune mechanism: Secondary | ICD-10-CM | POA: Diagnosis not present

## 2022-10-19 DIAGNOSIS — D72819 Decreased white blood cell count, unspecified: Secondary | ICD-10-CM | POA: Diagnosis not present

## 2022-10-19 DIAGNOSIS — Z0000 Encounter for general adult medical examination without abnormal findings: Secondary | ICD-10-CM | POA: Diagnosis not present

## 2022-10-31 ENCOUNTER — Encounter: Payer: Self-pay | Admitting: Internal Medicine

## 2022-10-31 ENCOUNTER — Inpatient Hospital Stay: Payer: Medicare HMO | Attending: Internal Medicine | Admitting: Internal Medicine

## 2022-10-31 ENCOUNTER — Inpatient Hospital Stay: Payer: Medicare HMO

## 2022-10-31 VITALS — BP 116/75 | HR 73 | Temp 97.6°F | Resp 20 | Wt 162.0 lb

## 2022-10-31 DIAGNOSIS — Z87891 Personal history of nicotine dependence: Secondary | ICD-10-CM | POA: Diagnosis not present

## 2022-10-31 DIAGNOSIS — D709 Neutropenia, unspecified: Secondary | ICD-10-CM | POA: Diagnosis not present

## 2022-10-31 DIAGNOSIS — Z7982 Long term (current) use of aspirin: Secondary | ICD-10-CM | POA: Insufficient documentation

## 2022-10-31 DIAGNOSIS — I10 Essential (primary) hypertension: Secondary | ICD-10-CM | POA: Insufficient documentation

## 2022-10-31 DIAGNOSIS — K219 Gastro-esophageal reflux disease without esophagitis: Secondary | ICD-10-CM | POA: Diagnosis not present

## 2022-10-31 DIAGNOSIS — Z79899 Other long term (current) drug therapy: Secondary | ICD-10-CM | POA: Diagnosis not present

## 2022-10-31 LAB — CBC WITH DIFFERENTIAL/PLATELET
Abs Immature Granulocytes: 0.01 10*3/uL (ref 0.00–0.07)
Basophils Absolute: 0 10*3/uL (ref 0.0–0.1)
Basophils Relative: 1 %
Eosinophils Absolute: 0.1 10*3/uL (ref 0.0–0.5)
Eosinophils Relative: 2 %
HCT: 39.1 % (ref 39.0–52.0)
Hemoglobin: 13.2 g/dL (ref 13.0–17.0)
Immature Granulocytes: 0 %
Lymphocytes Relative: 33 %
Lymphs Abs: 1.4 10*3/uL (ref 0.7–4.0)
MCH: 29.9 pg (ref 26.0–34.0)
MCHC: 33.8 g/dL (ref 30.0–36.0)
MCV: 88.7 fL (ref 80.0–100.0)
Monocytes Absolute: 0.3 10*3/uL (ref 0.1–1.0)
Monocytes Relative: 7 %
Neutro Abs: 2.4 10*3/uL (ref 1.7–7.7)
Neutrophils Relative %: 57 %
Platelets: 217 10*3/uL (ref 150–400)
RBC: 4.41 MIL/uL (ref 4.22–5.81)
RDW: 13.5 % (ref 11.5–15.5)
WBC: 4.2 10*3/uL (ref 4.0–10.5)
nRBC: 0 % (ref 0.0–0.2)

## 2022-10-31 LAB — COMPREHENSIVE METABOLIC PANEL
ALT: 14 U/L (ref 0–44)
AST: 19 U/L (ref 15–41)
Albumin: 4.8 g/dL (ref 3.5–5.0)
Alkaline Phosphatase: 70 U/L (ref 38–126)
Anion gap: 10 (ref 5–15)
BUN: 15 mg/dL (ref 8–23)
CO2: 25 mmol/L (ref 22–32)
Calcium: 9.8 mg/dL (ref 8.9–10.3)
Chloride: 102 mmol/L (ref 98–111)
Creatinine, Ser: 1.19 mg/dL (ref 0.61–1.24)
GFR, Estimated: >60 mL/min (ref >60)
Glucose, Bld: 89 mg/dL (ref 70–99)
Potassium: 3.9 mmol/L (ref 3.5–5.1)
Sodium: 137 mmol/L (ref 135–145)
Total Bilirubin: 0.8 mg/dL (ref 0.3–1.2)
Total Protein: 8.1 g/dL (ref 6.5–8.1)

## 2022-10-31 LAB — IRON AND TIBC
Iron: 93 ug/dL (ref 45–182)
Saturation Ratios: 21 % (ref 17.9–39.5)
TIBC: 440 ug/dL (ref 250–450)
UIBC: 347 ug/dL

## 2022-10-31 LAB — HEPATITIS B CORE ANTIBODY, IGM: Hep B C IgM: NONREACTIVE

## 2022-10-31 LAB — HEPATITIS C ANTIBODY: HCV Ab: NONREACTIVE

## 2022-10-31 LAB — FOLATE: Folate: 12.4 ng/mL (ref >5.9)

## 2022-10-31 LAB — FERRITIN: Ferritin: 73 ng/mL (ref 24–336)

## 2022-10-31 LAB — HEPATITIS B SURFACE ANTIGEN: Hepatitis B Surface Ag: NONREACTIVE

## 2022-10-31 LAB — LACTATE DEHYDROGENASE: LDH: 113 U/L (ref 98–192)

## 2022-10-31 LAB — VITAMIN B12: Vitamin B-12: 405 pg/mL (ref 180–914)

## 2022-10-31 LAB — HIV ANTIBODY (ROUTINE TESTING W REFLEX): HIV Screen 4th Generation wRfx: NONREACTIVE

## 2022-10-31 NOTE — Progress Notes (Signed)
[facility]  [phone] [phone]  ID: Ricky Gross OB: 27-Jul-1955  MR#: [id number]  AOZ#:[id number]  Patient Care Team: Marisue Ivan, MD as PCP - General (Family Medicine)  REFERRING PROVIDER: Dr. Burnadette Pop  REASON FOR REFERRAL: Leukopenia  HPI: Ricky Gross is a 67 y.o. male with past medical history of hypertension and GERD was referred to hematology for workup of leukopenia and neutropenia.  Patient has been feeling well overall.  Denies any history of frequent infections.  Denies smoking.  Drinks 3-4 beers per week. On lab review, Patient has mild leukopenia with WBC around 3 at least since 2018 with ANC ranging between 1.1 to normal.  Platelets are normal.  Hemoglobin normal.  I could not find lab work prior to 2018.   REVIEW OF SYSTEMS:   ROS  As per HPI. Otherwise, a complete review of systems is negative.  PAST MEDICAL HISTORY: Past Medical History:  Diagnosis Date   GERD (gastroesophageal reflux disease)    Hypertension     PAST SURGICAL HISTORY: Past Surgical History:  Procedure Laterality Date   NECK SURGERY     about 30 years ago at [facility]    FAMILY HISTORY: History reviewed. No pertinent family history.  HEALTH MAINTENANCE: Social History   Tobacco Use   Smoking status: Former    Current packs/day: 0.00    Types: Cigarettes    Quit date: 1976    Years since quitting: 48.5   Smokeless tobacco: Never  Vaping Use   Vaping status: Never Used  Substance Use Topics   Alcohol use: Yes    Alcohol/week: 2.0 standard drinks of alcohol    Types: 2 Cans of beer per week    Comment: Patient reports that he drink a beer a two week while watching football, if he drinks   Drug use: Never     No Known Allergies  Current Outpatient Medications  Medication Sig Dispense Refill   amLODipine (NORVASC) 10 MG tablet Take 1 tablet by mouth daily.     aspirin EC 81 MG tablet Take 1 tablet by mouth  daily.     losartan (COZAAR) 100 MG tablet Take 1 tablet by mouth at bedtime.     lovastatin (MEVACOR) 20 MG tablet Take 1 tablet by mouth at bedtime.     Multiple Vitamin (MULTIVITAMIN) tablet Take 1 tablet by mouth daily.     omeprazole (PRILOSEC) 20 MG capsule Take 20 mg by mouth daily.     pantoprazole (PROTONIX) 40 MG tablet Take by mouth.     gabapentin (NEURONTIN) 300 MG capsule Take 1-2 capsules by mouth 3 (three) times daily.  2 po q am, 1 po q afternoon, 2 po q hs (Patient not taking: Reported on 10/31/2022)     lisinopril-hydrochlorothiazide (ZESTORETIC) 10-12.5 MG tablet Take 1 tablet by mouth daily.     sodium chloride 1 g tablet Take 1 tablet (1 g total) by mouth 3 (three) times daily with meals. (Patient not taking: Reported on 10/31/2022) 90 tablet 0   No current facility-administered medications for this visit.    OBJECTIVE: Vitals:   10/31/22 1107  BP: 116/75  Pulse: 73  Resp: 20  Temp: 97.6 F (36.4 C)  SpO2: 100%     Body mass index is 26.96 kg/m.      General: Well-developed, well-nourished, no acute distress. Eyes: Pink conjunctiva, anicteric sclera. HEENT: Normocephalic, moist mucous membranes, clear oropharnyx. Lungs: Clear to auscultation bilaterally. Heart: Regular rate and rhythm.  No rubs, murmurs, or gallops. Abdomen: Soft, nontender, nondistended. No organomegaly noted, normoactive bowel sounds. Musculoskeletal: No edema, cyanosis, or clubbing. Neuro: Alert, answering all questions appropriately. Cranial nerves grossly intact. Skin: No rashes or petechiae noted. Psych: Normal affect. Lymphatics: No cervical, calvicular, axillary or inguinal LAD.   LAB RESULTS:  Lab Results  Component Value Date   NA 132 (L) 11/24/2021   K 4.3 11/24/2021   CL 102 11/24/2021   CO2 23 11/24/2021   GLUCOSE 107 (H) 11/24/2021   BUN 14 11/24/2021   CREATININE 1.07 11/24/2021   CALCIUM 8.6 (L) 11/24/2021   PROT 6.8 04/22/2022   ALBUMIN 4.4 04/22/2022   AST 19  04/22/2022   ALT 14 04/22/2022   ALKPHOS 81 04/22/2022   BILITOT 0.5 04/22/2022   GFRNONAA >60 11/24/2021   GFRAA >60 08/02/2019    Lab Results  Component Value Date   WBC 3.2 (L) 01/13/2022   NEUTROABS 1.4 01/13/2022   HGB 13.8 01/13/2022   HCT 42.7 01/13/2022   MCV 93 01/13/2022   PLT 253 01/13/2022    No results found for: "TIBC", "FERRITIN", "IRONPCTSAT"   STUDIES: No results found.  ASSESSMENT AND PLAN:   Ricky Gross is a 67 y.o. male with pmh of hypertension and GERD was referred to hematology for workup of leukopenia and neutropenia.  # Leukopenia # Neutropenia - Progressive.  Of unclear etiology. -On lab review, Patient has mild leukopenia with WBC around 3 at least since 2018 with ANC ranging between 1.1 to normal.  Platelets are normal.  Hemoglobin normal.  I could not find lab work prior to 2018.  -Patient denies any history of frequent infections.  Denies smoking.  Drinks beer 3 to 4/week.  -I discussed with the patient about potential etiologies for neutropenia such as nutritional deficiency, autoimmune process, bone marrow disorder, viral infection.  I will obtain lab work as below to complete the workup.  I also discussed about the possibility of constitutional neutropenia which can be seen in patients of African-American ethnicity which is a benign condition and does not need any intervention as it does not increase the risk of infections.  I have asked him to check for any lab work prior to 2018.   Orders Placed This Encounter  Procedures   CBC with Differential/Platelet   Comprehensive metabolic panel   Flow cytometry panel-leukemia/lymphoma work-up   HIV ANTIBODY (ROUTINE TETSING W RELFEX)   Hepatitis B surface antigen   Hepatitis B core antibody, IgM   Hepatitis C antibody   Iron and TIBC   Ferritin   Vitamin B12   Folate   ANA w/Reflex   Lactate dehydrogenase   Multiple Myeloma Panel (SPEP&IFE w/QIG)   Kappa/lambda light chains   RTC  in 2 weeks via video visit to discuss labs.  Patient expressed understanding and was in agreement with this plan. He also understands that He can call clinic at any time with any questions, concerns, or complaints.   I spent a total of 45 minutes reviewing chart data, face-to-face evaluation with the patient, counseling and coordination of care as detailed above.  Michaelyn Barter, MD   10/31/2022 11:42 AM

## 2022-11-01 DIAGNOSIS — N182 Chronic kidney disease, stage 2 (mild): Secondary | ICD-10-CM | POA: Diagnosis not present

## 2022-11-01 DIAGNOSIS — E871 Hypo-osmolality and hyponatremia: Secondary | ICD-10-CM | POA: Diagnosis not present

## 2022-11-01 DIAGNOSIS — I10 Essential (primary) hypertension: Secondary | ICD-10-CM | POA: Diagnosis not present

## 2022-11-01 LAB — ANA W/REFLEX: Anti Nuclear Antibody (ANA): NEGATIVE

## 2022-11-01 LAB — KAPPA/LAMBDA LIGHT CHAINS
Kappa free light chain: 17.3 mg/L (ref 3.3–19.4)
Kappa, lambda light chain ratio: 1.36 (ref 0.26–1.65)
Lambda free light chains: 12.7 mg/L (ref 5.7–26.3)

## 2022-11-02 LAB — COMP PANEL: LEUKEMIA/LYMPHOMA

## 2022-11-03 LAB — MULTIPLE MYELOMA PANEL, SERUM
Albumin SerPl Elph-Mcnc: 4.3 g/dL (ref 2.9–4.4)
Albumin/Glob SerPl: 1.4 (ref 0.7–1.7)
Alpha 1: 0.3 g/dL (ref 0.0–0.4)
Alpha2 Glob SerPl Elph-Mcnc: 0.6 g/dL (ref 0.4–1.0)
B-Globulin SerPl Elph-Mcnc: 1.1 g/dL (ref 0.7–1.3)
Gamma Glob SerPl Elph-Mcnc: 1.1 g/dL (ref 0.4–1.8)
Globulin, Total: 3.1 g/dL (ref 2.2–3.9)
IgA: 238 mg/dL (ref 61–437)
IgG (Immunoglobin G), Serum: 1077 mg/dL (ref 603–1613)
IgM (Immunoglobulin M), Srm: 35 mg/dL (ref 20–172)
Total Protein ELP: 7.4 g/dL (ref 6.0–8.5)

## 2022-11-14 ENCOUNTER — Telehealth: Payer: Self-pay

## 2022-11-14 ENCOUNTER — Inpatient Hospital Stay: Payer: Medicare HMO | Admitting: Internal Medicine

## 2022-11-14 DIAGNOSIS — D709 Neutropenia, unspecified: Secondary | ICD-10-CM

## 2022-11-14 NOTE — Progress Notes (Signed)
[facility]  [phone] [phone]  I connected with Ricky Gross on 11/14/22 at 10:45 AM EDT by my chart video and verified that I am speaking with the correct person using two identifiers.   I discussed the limitations, risks, security and privacy concerns of performing an evaluation and management service by telemedicine and the availability of in-person appointments. I also discussed with the patient that there may be a patient responsible charge related to this service. The patient expressed understanding and agreed to proceed.   Other persons participating in the visit and their role in the encounter: wife   Patient's location: home  Provider's location: clinic   Chief Complaint: discuss labs   ID: WOLFRAM SIMPKINS OB: 1955/09/09  MR#: [id number]  AOZ#:[id number]  Patient Care Team: Marisue Ivan, MD as PCP - General (Family Medicine)   HPI: Ricky Gross is a 67 y.o. male with past medical history of hypertension and GERD was referred to hematology for workup of leukopenia and neutropenia.  Patient has been feeling well overall.  Denies any history of frequent infections.  Denies smoking.  Drinks 3-4 beers per week. On lab review, Patient has mild leukopenia with WBC around 3 at least since 2018 with ANC ranging between 1.1 to normal.  Platelets are normal.  Hemoglobin normal.  I could not find lab work prior to 2018.  Interval history Connected with the patient via MyChart video visit. Patient has been feeling well overall.  Denies any new concerns.  REVIEW OF SYSTEMS:   ROS  As per HPI. Otherwise, a complete review of systems is negative.  PAST MEDICAL HISTORY: Past Medical History:  Diagnosis Date   GERD (gastroesophageal reflux disease)    Hypertension     PAST SURGICAL HISTORY: Past Surgical History:  Procedure Laterality Date   NECK SURGERY     about 30 years ago at [facility]    FAMILY  HISTORY: No family history on file.  HEALTH MAINTENANCE: Social History   Tobacco Use   Smoking status: Former    Current packs/day: 0.00    Types: Cigarettes    Quit date: 1976    Years since quitting: 48.6   Smokeless tobacco: Never  Vaping Use   Vaping status: Never Used  Substance Use Topics   Alcohol use: Yes    Alcohol/week: 2.0 standard drinks of alcohol    Types: 2 Cans of beer per week    Comment: Patient reports that he drink a beer a two week while watching football, if he drinks   Drug use: Never     No Known Allergies  Current Outpatient Medications  Medication Sig Dispense Refill   amLODipine (NORVASC) 10 MG tablet Take 1 tablet by mouth daily.     aspirin EC 81 MG tablet Take 1 tablet by mouth daily.     gabapentin (NEURONTIN) 300 MG capsule Take 1-2 capsules by mouth 3 (three) times daily.  2 po q am, 1 po q afternoon, 2 po q hs (Patient not taking: Reported on 10/31/2022)     lisinopril-hydrochlorothiazide (ZESTORETIC) 10-12.5 MG tablet Take 1 tablet by mouth daily.     losartan (COZAAR) 100 MG tablet Take 1 tablet by mouth at bedtime.     lovastatin (MEVACOR) 20 MG tablet Take 1 tablet by mouth at bedtime.     Multiple Vitamin (MULTIVITAMIN) tablet Take 1 tablet by mouth daily.     omeprazole (PRILOSEC) 20 MG capsule Take 20 mg by mouth  daily.     pantoprazole (PROTONIX) 40 MG tablet Take by mouth.     sodium chloride 1 g tablet Take 1 tablet (1 g total) by mouth 3 (three) times daily with meals. (Patient not taking: Reported on 10/31/2022) 90 tablet 0   No current facility-administered medications for this visit.    OBJECTIVE: There were no vitals filed for this visit.    There is no height or weight on file to calculate BMI.      General: Well-developed, well-nourished, no acute distress. Eyes: Pink conjunctiva, anicteric sclera. HEENT: Normocephalic, moist mucous membranes, clear oropharnyx. Lungs: Clear to auscultation bilaterally. Heart: Regular  rate and rhythm. No rubs, murmurs, or gallops. Abdomen: Soft, nontender, nondistended. No organomegaly noted, normoactive bowel sounds. Musculoskeletal: No edema, cyanosis, or clubbing. Neuro: Alert, answering all questions appropriately. Cranial nerves grossly intact. Skin: No rashes or petechiae noted. Psych: Normal affect. Lymphatics: No cervical, calvicular, axillary or inguinal LAD.   LAB RESULTS:  Lab Results  Component Value Date   NA 137 10/31/2022   K 3.9 10/31/2022   CL 102 10/31/2022   CO2 25 10/31/2022   GLUCOSE 89 10/31/2022   BUN 15 10/31/2022   CREATININE 1.19 10/31/2022   CALCIUM 9.8 10/31/2022   PROT 8.1 10/31/2022   ALBUMIN 4.8 10/31/2022   AST 19 10/31/2022   ALT 14 10/31/2022   ALKPHOS 70 10/31/2022   BILITOT 0.8 10/31/2022   GFRNONAA >60 10/31/2022   GFRAA >60 08/02/2019    Lab Results  Component Value Date   WBC 4.2 10/31/2022   NEUTROABS 2.4 10/31/2022   HGB 13.2 10/31/2022   HCT 39.1 10/31/2022   MCV 88.7 10/31/2022   PLT 217 10/31/2022    Lab Results  Component Value Date   TIBC 440 10/31/2022   FERRITIN 73 10/31/2022   IRONPCTSAT 21 10/31/2022     STUDIES: No results found.  ASSESSMENT AND PLAN:   Ricky Gross is a 67 y.o. male with pmh of hypertension and GERD was referred to hematology for workup of leukopenia and neutropenia.  # Leukopenia # Neutropenia - Progressive.  Of unclear etiology. -On lab review, Patient has mild leukopenia with WBC around 3 at least since 2018 with ANC ranging between 1.1 to normal.  Platelets are normal.  Hemoglobin normal.  I could not find lab work prior to 2018.  -Patient denies any history of frequent infections.  Denies smoking.  Drinks beer 3 to 4/week.  -Workup so far- Vitamin B12 405, folate 12.4, ANA negative, LDH 113, SPEP/IFE no M spike, kappa and lambda normal, iron panel normal, HIV/hepatitis C/hepatitis B negative, flow cytometry normal, repeat CBC showed normal WBC of 4.2,  hemoglobin 12.2, platelets 217.  Discussed with the patient that the workup so far has been negative.  On repeat CBC, his WBC has normalized.  No indication for further testing or bone marrow biopsy at this time.  Intermittent leukopenia is likely benign in nature.  He will continue to follow with PCP for annual CBC with differential.  He can be referred back to hematology if leukopenia persist and is worsening.  He will follow-up with me as needed.   No orders of the defined types were placed in this encounter.  RTC as needed  Patient expressed understanding and was in agreement with this plan. He also understands that He can call clinic at any time with any questions, concerns, or complaints.   I spent a total of 25 minutes reviewing chart data, face-to-face evaluation  with the patient, counseling and coordination of care as detailed above.  Michaelyn Barter, MD   11/14/2022 12:04 PM

## 2022-11-14 NOTE — Telephone Encounter (Signed)
Tried calling patient for MyChart visit but no answer and couldn't leave a voicemail message.

## 2022-11-14 NOTE — Telephone Encounter (Signed)
Tried to call patient about his MyChart visit with Dr. Mervyn Skeeters today but no answer and I was unable to leave a voicemail message.

## 2022-11-14 NOTE — Progress Notes (Signed)
Tried to call patient for his MyChart visit with Dr. Mervyn Skeeters today but no answer and I couldn't leave a voicemail message.

## 2022-12-13 DIAGNOSIS — I10 Essential (primary) hypertension: Secondary | ICD-10-CM | POA: Diagnosis not present

## 2022-12-13 DIAGNOSIS — E871 Hypo-osmolality and hyponatremia: Secondary | ICD-10-CM | POA: Diagnosis not present

## 2022-12-13 DIAGNOSIS — N182 Chronic kidney disease, stage 2 (mild): Secondary | ICD-10-CM | POA: Diagnosis not present

## 2023-04-14 DIAGNOSIS — I10 Essential (primary) hypertension: Secondary | ICD-10-CM | POA: Diagnosis not present

## 2023-04-14 DIAGNOSIS — R7303 Prediabetes: Secondary | ICD-10-CM | POA: Diagnosis not present

## 2023-04-14 DIAGNOSIS — E7800 Pure hypercholesterolemia, unspecified: Secondary | ICD-10-CM | POA: Diagnosis not present

## 2023-04-14 DIAGNOSIS — D72819 Decreased white blood cell count, unspecified: Secondary | ICD-10-CM | POA: Diagnosis not present

## 2023-04-21 DIAGNOSIS — I10 Essential (primary) hypertension: Secondary | ICD-10-CM | POA: Diagnosis not present

## 2023-04-21 DIAGNOSIS — E7800 Pure hypercholesterolemia, unspecified: Secondary | ICD-10-CM | POA: Diagnosis not present

## 2023-04-21 DIAGNOSIS — D72819 Decreased white blood cell count, unspecified: Secondary | ICD-10-CM | POA: Diagnosis not present

## 2023-04-21 DIAGNOSIS — Z862 Personal history of diseases of the blood and blood-forming organs and certain disorders involving the immune mechanism: Secondary | ICD-10-CM | POA: Diagnosis not present

## 2023-04-21 DIAGNOSIS — R7303 Prediabetes: Secondary | ICD-10-CM | POA: Diagnosis not present

## 2023-05-02 DIAGNOSIS — I10 Essential (primary) hypertension: Secondary | ICD-10-CM | POA: Diagnosis not present

## 2023-05-02 DIAGNOSIS — N182 Chronic kidney disease, stage 2 (mild): Secondary | ICD-10-CM | POA: Diagnosis not present

## 2023-05-02 DIAGNOSIS — E871 Hypo-osmolality and hyponatremia: Secondary | ICD-10-CM | POA: Diagnosis not present

## 2023-05-04 DIAGNOSIS — E871 Hypo-osmolality and hyponatremia: Secondary | ICD-10-CM | POA: Diagnosis not present

## 2023-05-04 DIAGNOSIS — I10 Essential (primary) hypertension: Secondary | ICD-10-CM | POA: Diagnosis not present

## 2023-05-04 DIAGNOSIS — N182 Chronic kidney disease, stage 2 (mild): Secondary | ICD-10-CM | POA: Diagnosis not present

## 2023-05-29 DIAGNOSIS — H5203 Hypermetropia, bilateral: Secondary | ICD-10-CM | POA: Diagnosis not present

## 2023-10-24 DIAGNOSIS — D72819 Decreased white blood cell count, unspecified: Secondary | ICD-10-CM | POA: Diagnosis not present

## 2023-10-24 DIAGNOSIS — Z862 Personal history of diseases of the blood and blood-forming organs and certain disorders involving the immune mechanism: Secondary | ICD-10-CM | POA: Diagnosis not present

## 2023-10-24 DIAGNOSIS — R7303 Prediabetes: Secondary | ICD-10-CM | POA: Diagnosis not present

## 2023-10-24 DIAGNOSIS — I10 Essential (primary) hypertension: Secondary | ICD-10-CM | POA: Diagnosis not present

## 2023-10-24 DIAGNOSIS — E871 Hypo-osmolality and hyponatremia: Secondary | ICD-10-CM | POA: Diagnosis not present

## 2023-10-24 DIAGNOSIS — E7800 Pure hypercholesterolemia, unspecified: Secondary | ICD-10-CM | POA: Diagnosis not present

## 2023-10-31 DIAGNOSIS — D72819 Decreased white blood cell count, unspecified: Secondary | ICD-10-CM | POA: Diagnosis not present

## 2023-10-31 DIAGNOSIS — E785 Hyperlipidemia, unspecified: Secondary | ICD-10-CM | POA: Diagnosis not present

## 2023-10-31 DIAGNOSIS — Z1331 Encounter for screening for depression: Secondary | ICD-10-CM | POA: Diagnosis not present

## 2023-10-31 DIAGNOSIS — Z862 Personal history of diseases of the blood and blood-forming organs and certain disorders involving the immune mechanism: Secondary | ICD-10-CM | POA: Diagnosis not present

## 2023-10-31 DIAGNOSIS — R7303 Prediabetes: Secondary | ICD-10-CM | POA: Diagnosis not present

## 2023-10-31 DIAGNOSIS — Z0000 Encounter for general adult medical examination without abnormal findings: Secondary | ICD-10-CM | POA: Diagnosis not present

## 2023-10-31 DIAGNOSIS — E871 Hypo-osmolality and hyponatremia: Secondary | ICD-10-CM | POA: Diagnosis not present

## 2023-10-31 DIAGNOSIS — I10 Essential (primary) hypertension: Secondary | ICD-10-CM | POA: Diagnosis not present

## 2023-11-06 DIAGNOSIS — I10 Essential (primary) hypertension: Secondary | ICD-10-CM | POA: Diagnosis not present

## 2023-11-06 DIAGNOSIS — E871 Hypo-osmolality and hyponatremia: Secondary | ICD-10-CM | POA: Diagnosis not present

## 2023-11-06 DIAGNOSIS — N182 Chronic kidney disease, stage 2 (mild): Secondary | ICD-10-CM | POA: Diagnosis not present

## 2023-11-08 DIAGNOSIS — D631 Anemia in chronic kidney disease: Secondary | ICD-10-CM | POA: Diagnosis not present

## 2023-11-08 DIAGNOSIS — N182 Chronic kidney disease, stage 2 (mild): Secondary | ICD-10-CM | POA: Diagnosis not present

## 2023-11-08 DIAGNOSIS — E871 Hypo-osmolality and hyponatremia: Secondary | ICD-10-CM | POA: Diagnosis not present

## 2023-11-08 DIAGNOSIS — I10 Essential (primary) hypertension: Secondary | ICD-10-CM | POA: Diagnosis not present

## 2024-01-16 ENCOUNTER — Other Ambulatory Visit: Payer: Self-pay | Admitting: Nephrology

## 2024-01-16 DIAGNOSIS — I10 Essential (primary) hypertension: Secondary | ICD-10-CM | POA: Diagnosis not present

## 2024-01-16 DIAGNOSIS — R10A1 Flank pain, right side: Secondary | ICD-10-CM

## 2024-01-16 DIAGNOSIS — D631 Anemia in chronic kidney disease: Secondary | ICD-10-CM | POA: Diagnosis not present

## 2024-01-16 DIAGNOSIS — R109 Unspecified abdominal pain: Secondary | ICD-10-CM | POA: Diagnosis not present

## 2024-01-16 DIAGNOSIS — N182 Chronic kidney disease, stage 2 (mild): Secondary | ICD-10-CM | POA: Diagnosis not present

## 2024-01-16 DIAGNOSIS — E871 Hypo-osmolality and hyponatremia: Secondary | ICD-10-CM | POA: Diagnosis not present

## 2024-01-18 DIAGNOSIS — Z0389 Encounter for observation for other suspected diseases and conditions ruled out: Secondary | ICD-10-CM | POA: Diagnosis not present

## 2024-01-19 ENCOUNTER — Ambulatory Visit: Attending: Nephrology

## 2024-05-06 ENCOUNTER — Other Ambulatory Visit: Payer: Self-pay | Admitting: Family Medicine

## 2024-05-06 DIAGNOSIS — Z9189 Other specified personal risk factors, not elsewhere classified: Secondary | ICD-10-CM

## 2024-05-06 DIAGNOSIS — I10 Essential (primary) hypertension: Secondary | ICD-10-CM

## 2024-05-06 DIAGNOSIS — E7800 Pure hypercholesterolemia, unspecified: Secondary | ICD-10-CM

## 2024-05-09 ENCOUNTER — Ambulatory Visit
Admission: RE | Admit: 2024-05-09 | Discharge: 2024-05-09 | Disposition: A | Discharge status: Home / Self Care | Payer: Self-pay | Source: Ambulatory Visit | Attending: Family Medicine | Admitting: Family Medicine

## 2024-05-09 DIAGNOSIS — Z9189 Other specified personal risk factors, not elsewhere classified: Secondary | ICD-10-CM | POA: Insufficient documentation

## 2024-05-09 DIAGNOSIS — E7800 Pure hypercholesterolemia, unspecified: Secondary | ICD-10-CM | POA: Insufficient documentation

## 2024-05-09 DIAGNOSIS — I10 Essential (primary) hypertension: Secondary | ICD-10-CM | POA: Insufficient documentation
